# Patient Record
Sex: Male | Born: 1955 | Race: White | Hispanic: No | Marital: Married | Smoking: Former smoker
Health system: Southern US, Community
[De-identification: ages and names within clinical notes are randomized; demographics above are authoritative.]

## PROBLEM LIST (undated history)

## (undated) DIAGNOSIS — F419 Anxiety disorder, unspecified: Secondary | ICD-10-CM

## (undated) DIAGNOSIS — I10 Essential (primary) hypertension: Secondary | ICD-10-CM

## (undated) DIAGNOSIS — E039 Hypothyroidism, unspecified: Secondary | ICD-10-CM

## (undated) DIAGNOSIS — Z87442 Personal history of urinary calculi: Secondary | ICD-10-CM

## (undated) DIAGNOSIS — E785 Hyperlipidemia, unspecified: Secondary | ICD-10-CM

## (undated) DIAGNOSIS — F329 Major depressive disorder, single episode, unspecified: Secondary | ICD-10-CM

## (undated) DIAGNOSIS — F32A Depression, unspecified: Secondary | ICD-10-CM

## (undated) DIAGNOSIS — Z8639 Personal history of other endocrine, nutritional and metabolic disease: Secondary | ICD-10-CM

## (undated) DIAGNOSIS — N529 Male erectile dysfunction, unspecified: Secondary | ICD-10-CM

## (undated) DIAGNOSIS — N486 Induration penis plastica: Secondary | ICD-10-CM

## (undated) HISTORY — PX: OTHER SURGICAL HISTORY: SHX169

## (undated) HISTORY — DX: Anxiety disorder, unspecified: F41.9

## (undated) HISTORY — DX: Induration penis plastica: N48.6

## (undated) HISTORY — DX: Male erectile dysfunction, unspecified: N52.9

## (undated) HISTORY — PX: APPENDECTOMY: SHX54

## (undated) HISTORY — PX: SHOULDER SURGERY: SHX246

## (undated) HISTORY — PX: HEMORRHOID SURGERY: SHX153

## (undated) HISTORY — DX: Hyperlipidemia, unspecified: E78.5

## (undated) HISTORY — DX: Personal history of other endocrine, nutritional and metabolic disease: Z86.39

---

## 2001-09-06 ENCOUNTER — Inpatient Hospital Stay (HOSPITAL_COMMUNITY): Admission: EM | Admit: 2001-09-06 | Discharge: 2001-09-08 | Payer: Self-pay | Admitting: Psychiatry

## 2002-02-14 ENCOUNTER — Emergency Department (HOSPITAL_COMMUNITY): Admission: EM | Admit: 2002-02-14 | Discharge: 2002-02-14 | Payer: Self-pay | Admitting: Emergency Medicine

## 2002-02-15 ENCOUNTER — Other Ambulatory Visit (HOSPITAL_COMMUNITY): Admission: RE | Admit: 2002-02-15 | Discharge: 2002-03-11 | Payer: Self-pay | Admitting: Psychiatry

## 2016-03-28 DIAGNOSIS — R972 Elevated prostate specific antigen [PSA]: Secondary | ICD-10-CM | POA: Diagnosis not present

## 2016-03-28 DIAGNOSIS — Z0000 Encounter for general adult medical examination without abnormal findings: Secondary | ICD-10-CM | POA: Diagnosis not present

## 2016-05-30 DIAGNOSIS — M7712 Lateral epicondylitis, left elbow: Secondary | ICD-10-CM | POA: Diagnosis not present

## 2016-06-07 DIAGNOSIS — M791 Myalgia: Secondary | ICD-10-CM | POA: Diagnosis not present

## 2016-06-07 DIAGNOSIS — F419 Anxiety disorder, unspecified: Secondary | ICD-10-CM | POA: Diagnosis not present

## 2016-06-10 DIAGNOSIS — M62838 Other muscle spasm: Secondary | ICD-10-CM | POA: Diagnosis not present

## 2016-08-05 DIAGNOSIS — Z125 Encounter for screening for malignant neoplasm of prostate: Secondary | ICD-10-CM | POA: Diagnosis not present

## 2016-08-05 DIAGNOSIS — I129 Hypertensive chronic kidney disease with stage 1 through stage 4 chronic kidney disease, or unspecified chronic kidney disease: Secondary | ICD-10-CM | POA: Diagnosis not present

## 2016-08-05 DIAGNOSIS — Z0001 Encounter for general adult medical examination with abnormal findings: Secondary | ICD-10-CM | POA: Diagnosis not present

## 2016-08-12 DIAGNOSIS — Z0001 Encounter for general adult medical examination with abnormal findings: Secondary | ICD-10-CM | POA: Diagnosis not present

## 2016-08-12 DIAGNOSIS — Z23 Encounter for immunization: Secondary | ICD-10-CM | POA: Diagnosis not present

## 2016-09-17 DIAGNOSIS — M25539 Pain in unspecified wrist: Secondary | ICD-10-CM | POA: Diagnosis not present

## 2016-09-17 DIAGNOSIS — M19042 Primary osteoarthritis, left hand: Secondary | ICD-10-CM | POA: Diagnosis not present

## 2016-09-17 DIAGNOSIS — M779 Enthesopathy, unspecified: Secondary | ICD-10-CM | POA: Diagnosis not present

## 2016-09-17 DIAGNOSIS — M1990 Unspecified osteoarthritis, unspecified site: Secondary | ICD-10-CM | POA: Diagnosis not present

## 2016-09-17 DIAGNOSIS — M19041 Primary osteoarthritis, right hand: Secondary | ICD-10-CM | POA: Diagnosis not present

## 2016-11-07 DIAGNOSIS — E890 Postprocedural hypothyroidism: Secondary | ICD-10-CM | POA: Diagnosis not present

## 2016-12-04 DIAGNOSIS — F339 Major depressive disorder, recurrent, unspecified: Secondary | ICD-10-CM | POA: Diagnosis not present

## 2016-12-04 DIAGNOSIS — N182 Chronic kidney disease, stage 2 (mild): Secondary | ICD-10-CM | POA: Diagnosis not present

## 2016-12-04 DIAGNOSIS — E785 Hyperlipidemia, unspecified: Secondary | ICD-10-CM | POA: Diagnosis not present

## 2016-12-04 DIAGNOSIS — I129 Hypertensive chronic kidney disease with stage 1 through stage 4 chronic kidney disease, or unspecified chronic kidney disease: Secondary | ICD-10-CM | POA: Diagnosis not present

## 2017-03-05 DIAGNOSIS — I129 Hypertensive chronic kidney disease with stage 1 through stage 4 chronic kidney disease, or unspecified chronic kidney disease: Secondary | ICD-10-CM | POA: Diagnosis not present

## 2017-03-05 DIAGNOSIS — E890 Postprocedural hypothyroidism: Secondary | ICD-10-CM | POA: Diagnosis not present

## 2017-03-05 DIAGNOSIS — E785 Hyperlipidemia, unspecified: Secondary | ICD-10-CM | POA: Diagnosis not present

## 2017-03-12 DIAGNOSIS — I129 Hypertensive chronic kidney disease with stage 1 through stage 4 chronic kidney disease, or unspecified chronic kidney disease: Secondary | ICD-10-CM | POA: Diagnosis not present

## 2017-03-12 DIAGNOSIS — F339 Major depressive disorder, recurrent, unspecified: Secondary | ICD-10-CM | POA: Diagnosis not present

## 2017-03-12 DIAGNOSIS — M25512 Pain in left shoulder: Secondary | ICD-10-CM | POA: Diagnosis not present

## 2017-03-12 DIAGNOSIS — E890 Postprocedural hypothyroidism: Secondary | ICD-10-CM | POA: Diagnosis not present

## 2017-03-12 DIAGNOSIS — M19011 Primary osteoarthritis, right shoulder: Secondary | ICD-10-CM | POA: Diagnosis not present

## 2017-03-12 DIAGNOSIS — R7989 Other specified abnormal findings of blood chemistry: Secondary | ICD-10-CM | POA: Diagnosis not present

## 2017-03-12 DIAGNOSIS — E785 Hyperlipidemia, unspecified: Secondary | ICD-10-CM | POA: Diagnosis not present

## 2017-03-12 DIAGNOSIS — M25511 Pain in right shoulder: Secondary | ICD-10-CM | POA: Diagnosis not present

## 2017-03-30 DIAGNOSIS — E0500 Thyrotoxicosis with diffuse goiter without thyrotoxic crisis or storm: Secondary | ICD-10-CM | POA: Diagnosis not present

## 2017-03-30 DIAGNOSIS — H35032 Hypertensive retinopathy, left eye: Secondary | ICD-10-CM | POA: Diagnosis not present

## 2017-03-30 DIAGNOSIS — H25013 Cortical age-related cataract, bilateral: Secondary | ICD-10-CM | POA: Diagnosis not present

## 2017-03-30 DIAGNOSIS — H35033 Hypertensive retinopathy, bilateral: Secondary | ICD-10-CM | POA: Diagnosis not present

## 2017-03-30 DIAGNOSIS — H2513 Age-related nuclear cataract, bilateral: Secondary | ICD-10-CM | POA: Diagnosis not present

## 2017-03-30 DIAGNOSIS — H35031 Hypertensive retinopathy, right eye: Secondary | ICD-10-CM | POA: Diagnosis not present

## 2017-04-24 DIAGNOSIS — E890 Postprocedural hypothyroidism: Secondary | ICD-10-CM | POA: Diagnosis not present

## 2017-04-24 DIAGNOSIS — Z0000 Encounter for general adult medical examination without abnormal findings: Secondary | ICD-10-CM | POA: Diagnosis not present

## 2017-05-19 DIAGNOSIS — R972 Elevated prostate specific antigen [PSA]: Secondary | ICD-10-CM | POA: Diagnosis not present

## 2017-05-19 DIAGNOSIS — E291 Testicular hypofunction: Secondary | ICD-10-CM | POA: Diagnosis not present

## 2017-05-21 ENCOUNTER — Other Ambulatory Visit: Payer: Self-pay | Admitting: Urology

## 2017-05-21 DIAGNOSIS — R972 Elevated prostate specific antigen [PSA]: Secondary | ICD-10-CM

## 2017-06-18 ENCOUNTER — Ambulatory Visit (HOSPITAL_COMMUNITY)
Admission: RE | Admit: 2017-06-18 | Discharge: 2017-06-18 | Disposition: A | Discharge status: Home / Self Care | Payer: BLUE CROSS/BLUE SHIELD | Source: Ambulatory Visit | Attending: Urology | Admitting: Urology

## 2017-06-18 DIAGNOSIS — R972 Elevated prostate specific antigen [PSA]: Secondary | ICD-10-CM

## 2017-06-18 LAB — POCT I-STAT CREATININE: CREATININE: 0.9 mg/dL (ref 0.61–1.24)

## 2017-06-18 MED ORDER — LIDOCAINE HCL 2 % EX GEL: 1.0000 "application " | Freq: Once | CUTANEOUS | Status: DC

## 2017-06-18 MED ORDER — LIDOCAINE HCL 2 % EX GEL
CUTANEOUS | Status: AC
  Filled 2017-06-18: qty 30

## 2017-06-18 MED ORDER — GADOBENATE DIMEGLUMINE 529 MG/ML IV SOLN
20.0000 mL | Freq: Once | INTRAVENOUS | Status: AC | PRN
  Administered 2017-06-18: 20 mL via INTRAVENOUS

## 2017-07-09 DIAGNOSIS — E291 Testicular hypofunction: Secondary | ICD-10-CM | POA: Diagnosis not present

## 2017-07-21 DIAGNOSIS — I10 Essential (primary) hypertension: Secondary | ICD-10-CM | POA: Diagnosis not present

## 2017-07-21 DIAGNOSIS — R1032 Left lower quadrant pain: Secondary | ICD-10-CM | POA: Diagnosis not present

## 2017-07-30 DIAGNOSIS — E079 Disorder of thyroid, unspecified: Secondary | ICD-10-CM | POA: Diagnosis not present

## 2017-07-30 DIAGNOSIS — H5022 Vertical strabismus, left eye: Secondary | ICD-10-CM | POA: Diagnosis not present

## 2017-07-30 DIAGNOSIS — H4912 Fourth [trochlear] nerve palsy, left eye: Secondary | ICD-10-CM | POA: Diagnosis not present

## 2017-08-05 DIAGNOSIS — Z0000 Encounter for general adult medical examination without abnormal findings: Secondary | ICD-10-CM | POA: Diagnosis not present

## 2017-08-05 DIAGNOSIS — E785 Hyperlipidemia, unspecified: Secondary | ICD-10-CM | POA: Diagnosis not present

## 2017-08-05 DIAGNOSIS — Z125 Encounter for screening for malignant neoplasm of prostate: Secondary | ICD-10-CM | POA: Diagnosis not present

## 2017-08-13 DIAGNOSIS — Z0001 Encounter for general adult medical examination with abnormal findings: Secondary | ICD-10-CM | POA: Diagnosis not present

## 2017-11-18 DIAGNOSIS — N486 Induration penis plastica: Secondary | ICD-10-CM | POA: Diagnosis not present

## 2017-11-18 DIAGNOSIS — E291 Testicular hypofunction: Secondary | ICD-10-CM | POA: Diagnosis not present

## 2017-11-18 DIAGNOSIS — R972 Elevated prostate specific antigen [PSA]: Secondary | ICD-10-CM | POA: Diagnosis not present

## 2018-02-22 DIAGNOSIS — E785 Hyperlipidemia, unspecified: Secondary | ICD-10-CM | POA: Diagnosis not present

## 2018-02-22 DIAGNOSIS — E039 Hypothyroidism, unspecified: Secondary | ICD-10-CM | POA: Diagnosis not present

## 2018-02-22 DIAGNOSIS — E291 Testicular hypofunction: Secondary | ICD-10-CM | POA: Diagnosis not present

## 2018-02-22 DIAGNOSIS — N182 Chronic kidney disease, stage 2 (mild): Secondary | ICD-10-CM | POA: Diagnosis not present

## 2018-03-01 DIAGNOSIS — E785 Hyperlipidemia, unspecified: Secondary | ICD-10-CM | POA: Diagnosis not present

## 2018-03-01 DIAGNOSIS — F419 Anxiety disorder, unspecified: Secondary | ICD-10-CM | POA: Diagnosis not present

## 2018-03-01 DIAGNOSIS — I10 Essential (primary) hypertension: Secondary | ICD-10-CM | POA: Diagnosis not present

## 2018-03-01 DIAGNOSIS — E890 Postprocedural hypothyroidism: Secondary | ICD-10-CM | POA: Diagnosis not present

## 2018-04-05 DIAGNOSIS — H35033 Hypertensive retinopathy, bilateral: Secondary | ICD-10-CM | POA: Diagnosis not present

## 2018-04-05 DIAGNOSIS — H25013 Cortical age-related cataract, bilateral: Secondary | ICD-10-CM | POA: Diagnosis not present

## 2018-04-05 DIAGNOSIS — H2513 Age-related nuclear cataract, bilateral: Secondary | ICD-10-CM | POA: Diagnosis not present

## 2018-04-05 DIAGNOSIS — H532 Diplopia: Secondary | ICD-10-CM | POA: Diagnosis not present

## 2018-05-19 DIAGNOSIS — E291 Testicular hypofunction: Secondary | ICD-10-CM | POA: Diagnosis not present

## 2018-05-19 DIAGNOSIS — R972 Elevated prostate specific antigen [PSA]: Secondary | ICD-10-CM | POA: Diagnosis not present

## 2018-05-25 DIAGNOSIS — E890 Postprocedural hypothyroidism: Secondary | ICD-10-CM | POA: Diagnosis not present

## 2018-05-26 DIAGNOSIS — N486 Induration penis plastica: Secondary | ICD-10-CM | POA: Diagnosis not present

## 2018-05-26 DIAGNOSIS — E291 Testicular hypofunction: Secondary | ICD-10-CM | POA: Diagnosis not present

## 2018-05-26 DIAGNOSIS — N5201 Erectile dysfunction due to arterial insufficiency: Secondary | ICD-10-CM | POA: Diagnosis not present

## 2018-06-16 ENCOUNTER — Other Ambulatory Visit: Payer: Self-pay | Admitting: Urology

## 2018-06-16 DIAGNOSIS — N132 Hydronephrosis with renal and ureteral calculous obstruction: Secondary | ICD-10-CM | POA: Diagnosis not present

## 2018-06-16 DIAGNOSIS — R109 Unspecified abdominal pain: Secondary | ICD-10-CM | POA: Diagnosis not present

## 2018-06-16 DIAGNOSIS — N201 Calculus of ureter: Secondary | ICD-10-CM | POA: Diagnosis not present

## 2018-06-18 ENCOUNTER — Encounter (HOSPITAL_COMMUNITY): Payer: Self-pay | Admitting: *Deleted

## 2018-06-21 ENCOUNTER — Ambulatory Visit (HOSPITAL_COMMUNITY)
Admission: RE | Admit: 2018-06-21 | Discharge: 2018-06-21 | Disposition: A | Discharge status: Home / Self Care | Payer: BLUE CROSS/BLUE SHIELD | Source: Ambulatory Visit | Attending: Urology | Admitting: Urology

## 2018-06-21 ENCOUNTER — Ambulatory Visit (HOSPITAL_COMMUNITY): Payer: BLUE CROSS/BLUE SHIELD

## 2018-06-21 ENCOUNTER — Encounter (HOSPITAL_COMMUNITY)
Admission: RE | Disposition: A | Discharge status: Home / Self Care | Payer: Self-pay | Source: Ambulatory Visit | Attending: Urology

## 2018-06-21 ENCOUNTER — Encounter (HOSPITAL_COMMUNITY): Payer: Self-pay | Admitting: General Practice

## 2018-06-21 DIAGNOSIS — Z87891 Personal history of nicotine dependence: Secondary | ICD-10-CM | POA: Insufficient documentation

## 2018-06-21 DIAGNOSIS — Z79899 Other long term (current) drug therapy: Secondary | ICD-10-CM | POA: Diagnosis not present

## 2018-06-21 DIAGNOSIS — N132 Hydronephrosis with renal and ureteral calculous obstruction: Secondary | ICD-10-CM | POA: Diagnosis not present

## 2018-06-21 DIAGNOSIS — Z6831 Body mass index (BMI) 31.0-31.9, adult: Secondary | ICD-10-CM | POA: Insufficient documentation

## 2018-06-21 DIAGNOSIS — N200 Calculus of kidney: Secondary | ICD-10-CM

## 2018-06-21 DIAGNOSIS — N201 Calculus of ureter: Secondary | ICD-10-CM

## 2018-06-21 DIAGNOSIS — I10 Essential (primary) hypertension: Secondary | ICD-10-CM | POA: Insufficient documentation

## 2018-06-21 DIAGNOSIS — F329 Major depressive disorder, single episode, unspecified: Secondary | ICD-10-CM | POA: Insufficient documentation

## 2018-06-21 DIAGNOSIS — E669 Obesity, unspecified: Secondary | ICD-10-CM | POA: Insufficient documentation

## 2018-06-21 DIAGNOSIS — E039 Hypothyroidism, unspecified: Secondary | ICD-10-CM | POA: Diagnosis not present

## 2018-06-21 DIAGNOSIS — Z01818 Encounter for other preprocedural examination: Secondary | ICD-10-CM | POA: Diagnosis not present

## 2018-06-21 DIAGNOSIS — Z87442 Personal history of urinary calculi: Secondary | ICD-10-CM | POA: Diagnosis not present

## 2018-06-21 HISTORY — DX: Personal history of urinary calculi: Z87.442

## 2018-06-21 HISTORY — PX: EXTRACORPOREAL SHOCK WAVE LITHOTRIPSY: SHX1557

## 2018-06-21 HISTORY — DX: Major depressive disorder, single episode, unspecified: F32.9

## 2018-06-21 HISTORY — DX: Hypothyroidism, unspecified: E03.9

## 2018-06-21 HISTORY — DX: Essential (primary) hypertension: I10

## 2018-06-21 HISTORY — DX: Depression, unspecified: F32.A

## 2018-06-21 SURGERY — LITHOTRIPSY, ESWL
Anesthesia: LOCAL | Laterality: Right

## 2018-06-21 MED ORDER — SODIUM CHLORIDE 0.9 % IV SOLN
Freq: Once | INTRAVENOUS | Status: AC
  Administered 2018-06-21: 07:00:00 via INTRAVENOUS

## 2018-06-21 MED ORDER — OXYCODONE-ACETAMINOPHEN 5-325 MG PO TABS: 1.0000 | ORAL_TABLET | Freq: Four times a day (QID) | ORAL | 0 refills | Status: DC | PRN

## 2018-06-21 MED ORDER — TAMSULOSIN HCL 0.4 MG PO CAPS: 0.4000 mg | ORAL_CAPSULE | Freq: Every day | ORAL | 0 refills | Status: DC

## 2018-06-21 MED ORDER — DIPHENHYDRAMINE HCL 25 MG PO CAPS
25.0000 mg | ORAL_CAPSULE | ORAL | Status: AC
  Administered 2018-06-21: 25 mg via ORAL
  Filled 2018-06-21: qty 1

## 2018-06-21 MED ORDER — CIPROFLOXACIN HCL 500 MG PO TABS
500.0000 mg | ORAL_TABLET | ORAL | Status: AC
  Administered 2018-06-21: 500 mg via ORAL
  Filled 2018-06-21: qty 1

## 2018-06-21 MED ORDER — DIAZEPAM 5 MG PO TABS
10.0000 mg | ORAL_TABLET | ORAL | Status: AC
  Administered 2018-06-21: 10 mg via ORAL
  Filled 2018-06-21: qty 2

## 2018-06-21 NOTE — H&P (Signed)
Troy Turner is an 62 y.o. male.    Chief Complaint: Pre-Op RIGHT Shckwave Lithotripsy  HPI:   1 - RIGHT Ureteral Stone -  9mm Rt prox ureteral stone with mod hydro by CT 05/2018 on eval right flank pain. Ston is 9mm, SSD 18cm, 1300HU. Several bilateral renal stones as well, non-obstructing. UA without infectious parameters.  Today "Troy Turner" is seen to proceed with RIGHT shockwave lithotripsy. NO interval high grade fevers.    Past Medical History:  Diagnosis Date  . Depression   . History of kidney stones   . Hypertension   . Hypothyroidism    history of graves disease    Past Surgical History:  Procedure Laterality Date  . APPENDECTOMY    . HEMORRHOID SURGERY    . undesended testicle      History reviewed. No pertinent family history. Social History:  reports that he quit smoking about 15 years ago. He has never used smokeless tobacco. He reports that he does not use drugs. His alcohol history is not on file.  Allergies: No Known Allergies  Medications Prior to Admission  Medication Sig Dispense Refill  . amLODipine (NORVASC) 10 MG tablet Take 10 mg by mouth daily.    . fosinopril (MONOPRIL) 20 MG tablet Take 20 mg by mouth daily.    . hydrochlorothiazide (MICROZIDE) 12.5 MG capsule Take 12.5 mg by mouth daily.    Marland Kitchen. levothyroxine (SYNTHROID, LEVOTHROID) 137 MCG tablet Take 137 mcg by mouth daily before breakfast.    . ondansetron (ZOFRAN) 8 MG tablet Take by mouth every 8 (eight) hours as needed for nausea or vomiting.    Marland Kitchen. oxyCODONE-acetaminophen (PERCOCET/ROXICET) 5-325 MG tablet Take by mouth every 6 (six) hours as needed for severe pain.    Marland Kitchen. sertraline (ZOLOFT) 100 MG tablet Take 100 mg by mouth daily.    . simvastatin (ZOCOR) 5 MG tablet Take 5 mg by mouth daily.    . tamsulosin (FLOMAX) 0.4 MG CAPS capsule Take 0.4 mg by mouth.    . traZODone (DESYREL) 100 MG tablet Take 100 mg by mouth at bedtime.      No results found for this or any previous visit (from  the past 48 hour(s)). No results found.  Review of Systems  Constitutional: Negative.   Eyes: Negative.   Respiratory: Negative.   Cardiovascular: Negative.   Genitourinary: Positive for flank pain.  Skin: Negative.   Neurological: Negative.   Endo/Heme/Allergies: Negative.   Psychiatric/Behavioral: Negative.     Blood pressure 139/79, pulse 69, temperature 99 F (37.2 C), temperature source Oral, resp. rate 18, height 5\' 11"  (1.803 m), weight 104 kg (229 lb 6 oz), SpO2 98 %. Physical Exam  Constitutional: He appears well-developed.  HENT:  Head: Normocephalic.  Neck: Normal range of motion.  Cardiovascular: Normal rate.  Respiratory: Effort normal.  GI:  Truncal obesity.   Genitourinary:  Genitourinary Comments: Mild Rt CVAT  Neurological: He is alert.  Skin: Skin is warm.  Psychiatric: He has a normal mood and affect.     Assessment/Plan  Proceed as planned with RIGHT SWL. Risks, benefits, alternatives, expected peri-op course discussed previously and reiterated today.   Sebastian AcheMANNY, Keyari Kleeman, MD 06/21/2018, 7:05 AM

## 2018-06-21 NOTE — Discharge Instructions (Signed)
1 - You may have urinary urgency (bladder spasms),. Pass small stone fragments,  and bloody urine on / off for about 2 weeks. This is normal.  2 - Call MD or go to ER for fever >102, severe pain / nausea / vomiting not relieved by medications, or acute change in medical status

## 2018-06-21 NOTE — Brief Op Note (Signed)
06/21/2018  8:40 AM  PATIENT:  Troy Turner  62 y.o. male  PRE-OPERATIVE DIAGNOSIS:  RIGHT PROXIMAL URETERAL STONE  POST-OPERATIVE DIAGNOSIS:  * No post-op diagnosis entered *  PROCEDURE:  Procedure(s): RIGHT EXTRACORPOREAL SHOCK WAVE LITHOTRIPSY (ESWL) (Right)  SURGEON:  Surgeon(s) and Role:    * Alexis Frock, MD - Primary  PHYSICIAN ASSISTANT:   ASSISTANTS: none   ANESTHESIA:   MAC  EBL:  none   BLOOD ADMINISTERED:none  DRAINS: none   LOCAL MEDICATIONS USED:  NONE  SPECIMEN:  No Specimen  DISPOSITION OF SPECIMEN:  N/A  COUNTS:  YES  TOURNIQUET:  * No tourniquets in log *  DICTATION: .Note written in paper chart  PLAN OF CARE: Discharge to home after PACU  PATIENT DISPOSITION:  Short Stay   Delay start of Pharmacological VTE agent (>24hrs) due to surgical blood loss or risk of bleeding: not applicable

## 2018-07-12 DIAGNOSIS — N200 Calculus of kidney: Secondary | ICD-10-CM | POA: Diagnosis not present

## 2018-07-12 DIAGNOSIS — R8271 Bacteriuria: Secondary | ICD-10-CM | POA: Diagnosis not present

## 2018-07-26 DIAGNOSIS — N202 Calculus of kidney with calculus of ureter: Secondary | ICD-10-CM | POA: Diagnosis not present

## 2018-07-26 DIAGNOSIS — R8271 Bacteriuria: Secondary | ICD-10-CM | POA: Diagnosis not present

## 2018-08-11 DIAGNOSIS — N200 Calculus of kidney: Secondary | ICD-10-CM | POA: Diagnosis not present

## 2018-08-26 DIAGNOSIS — Z0000 Encounter for general adult medical examination without abnormal findings: Secondary | ICD-10-CM | POA: Diagnosis not present

## 2018-08-26 DIAGNOSIS — Z125 Encounter for screening for malignant neoplasm of prostate: Secondary | ICD-10-CM | POA: Diagnosis not present

## 2018-08-26 DIAGNOSIS — E890 Postprocedural hypothyroidism: Secondary | ICD-10-CM | POA: Diagnosis not present

## 2018-08-30 IMAGING — MR MR PROSTATE WO/W CM
23 of 56 series · 23 of 56 positions shown · IV contrast (yes)
Comparison: None.

CLINICAL DATA: Elevated PSA level. Biopsy from 06/26/2015 revealed
intraepithelial neoplasia at the left base.

EXAM:
MR PROSTATE WITHOUT AND WITH CONTRAST
TECHNIQUE: Multiplanar multisequence MRI images were obtained of the pelvis
centered about the prostate. Pre and post contrast images were
obtained.
CONTRAST:  20mL MULTIHANCE GADOBENATE DIMEGLUMINE 529 MG/ML IV SOLN

[Series 3: bSSFP fat-sat · axial · 6.0mm · 0.86mm/px · 1 of 44 slices shown]
[im 1/44]
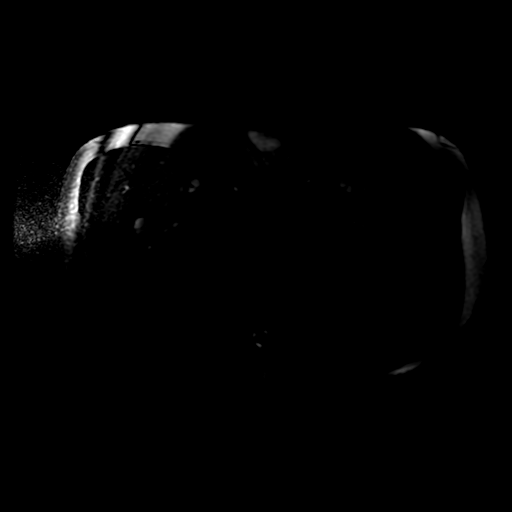

[Series 4: T1 · axial · 6.0mm · 0.86mm/px · 1 of 44 slices shown (1 of 2)]
[im 1/44]
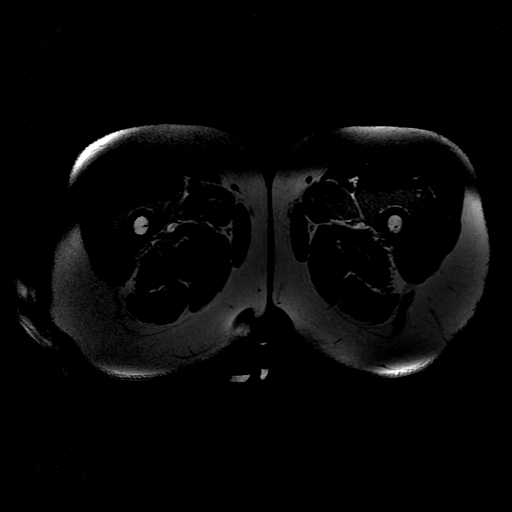

[Series 5: T2 · axial · 3.0mm · 0.29mm/px · 1 of 25 slices shown (1 of 4)]
[im 1/25]
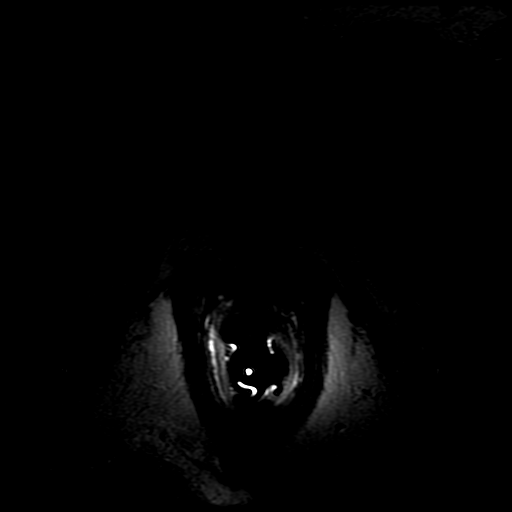

[Series 6: T1 · axial · 3.0mm · 0.29mm/px · 1 of 25 slices shown (2 of 2)]
[im 1/25]
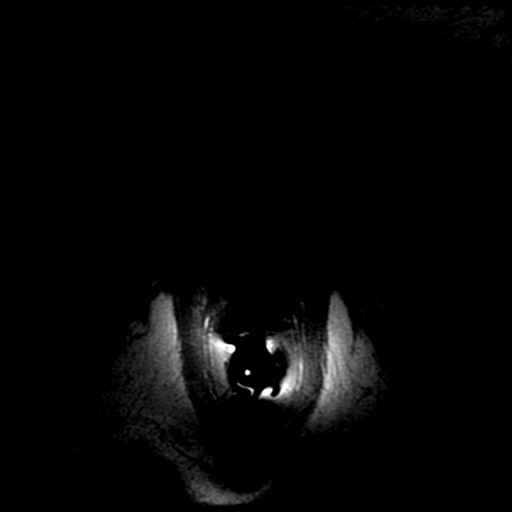

[Series 7: T2 · axial · 1.8mm · 0.47mm/px · 1 of 140 slices shown (2 of 4)]
[im 1/140]
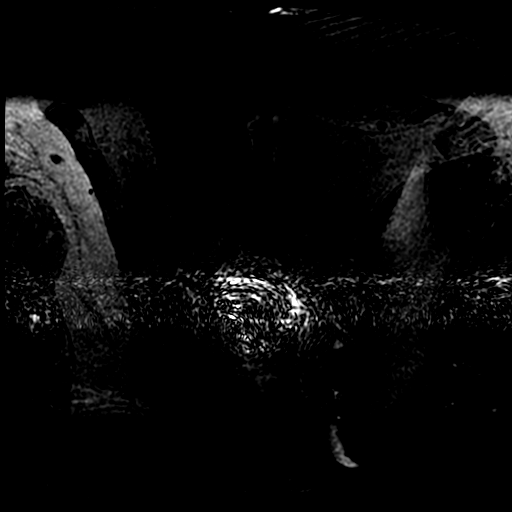

[Series 8: T2 · sagittal · 4.0mm · 0.29mm/px · 1 of 22 slices shown (3 of 4)]
[im 1/22]
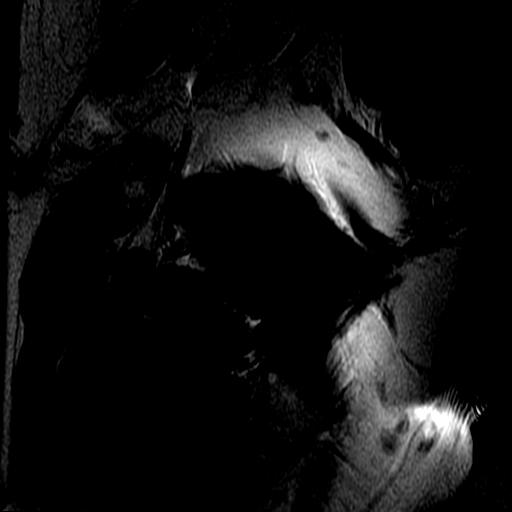

[Series 9: T2 · coronal · 4.0mm · 0.29mm/px · 1 of 21 slices shown (4 of 4)]
[im 1/21]
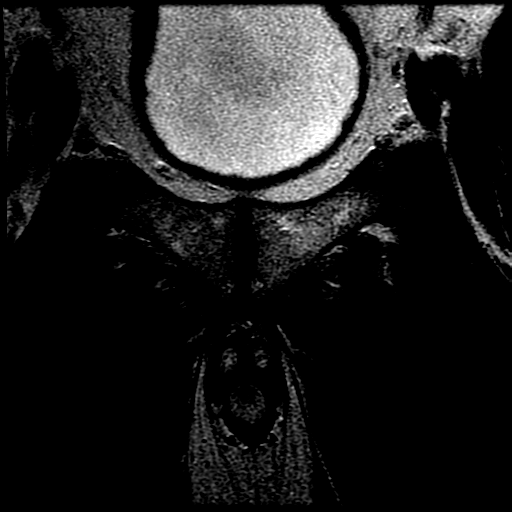

[Series 10: DWI · axial · 3.0mm · 0.59mm/px · 1 of 50 slices shown (1 of 6)]
[im 1/50]
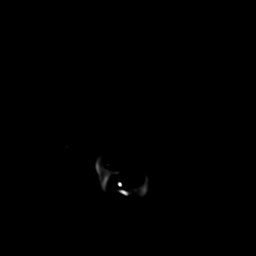

[Series 11: DWI · axial · 3.0mm · 0.59mm/px · 1 of 50 slices shown (2 of 6)]
[im 1/50]
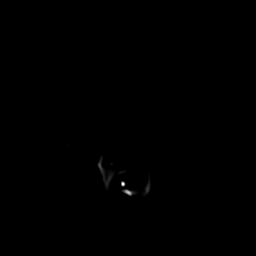

[Series 12: DWI · axial · 3.0mm · 0.59mm/px · 1 of 48 slices shown (3 of 6)]
[im 1/48]
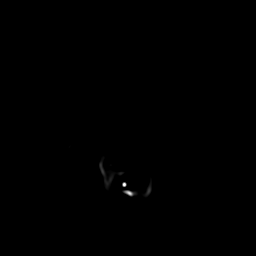

[Series 700: reformatted · coronal · 2.0mm · 0.39mm/px · 1 of 69 slices shown (1 of 2)]
[im 1/69]
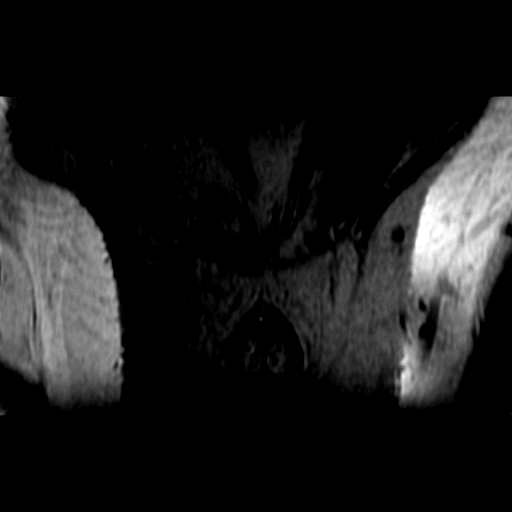

[Series 701: reformatted · sagittal · 2.0mm · 0.43mm/px · 1 of 63 slices shown (2 of 2)]
[im 1/63]
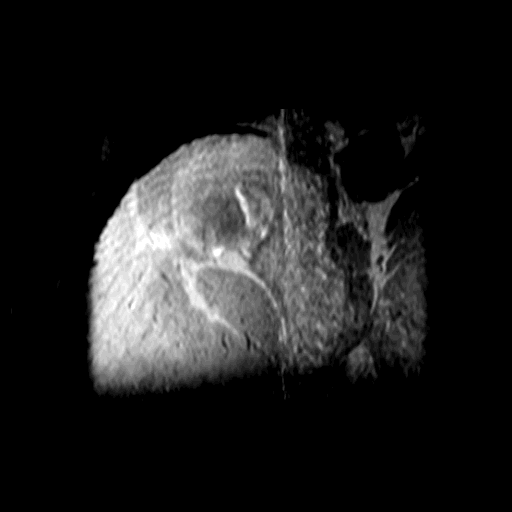

[Series 1000: DWI · axial · 3.0mm · 0.59mm/px · 1 of 25 slices shown (4 of 6)]
[im 1/25]
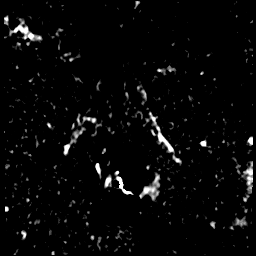

[Series 1100: DWI · axial · 3.0mm · 0.59mm/px · 1 of 25 slices shown (5 of 6)]
[im 1/25]
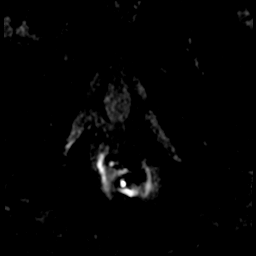

[Series 1200: DWI · axial · 3.0mm · 0.59mm/px · 1 of 25 slices shown (6 of 6)]
[im 1/25]
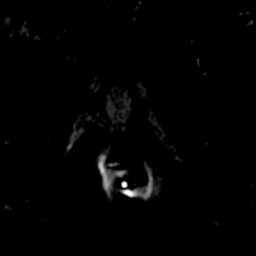

[((id)/(id)/1)-((id)/(id)/1) · axial · 3.0mm · 0.43mm/px · 1 of 68 slices shown (1 of 8)]
[im 1/68]
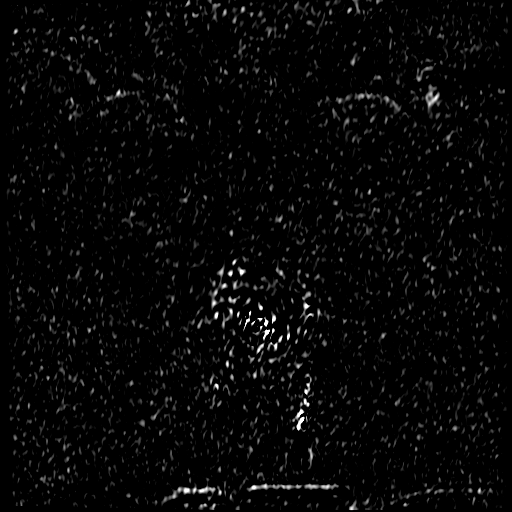

[((id)/(id)/1)-((id)/(id)/1) · axial · 3.0mm · 0.43mm/px · 1 of 68 slices shown (2 of 8)]
[im 1/68]
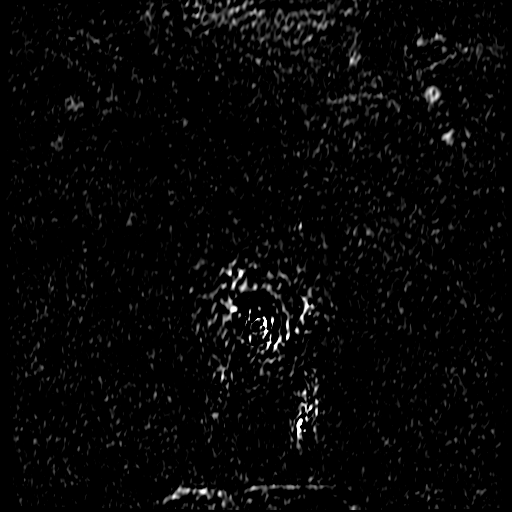

[((id)/(id)/1)-((id)/(id)/1) · axial · 3.0mm · 0.43mm/px · 1 of 68 slices shown (3 of 8)]
[im 1/68]
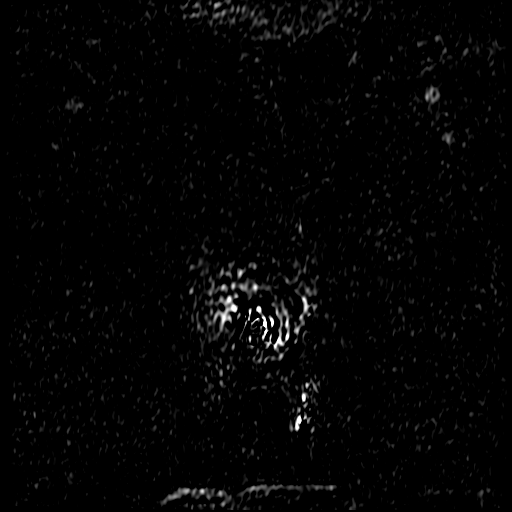

[((id)/(id)/1)-((id)/(id)/1) · axial · 3.0mm · 0.43mm/px · 1 of 68 slices shown (4 of 8)]
[im 1/68]
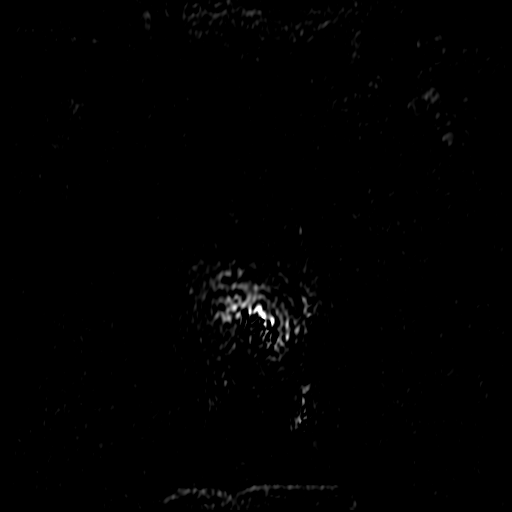

[((id)/(id)/1)-((id)/(id)/1) · axial · 3.0mm · 0.43mm/px · 1 of 68 slices shown (5 of 8)]
[im 1/68]
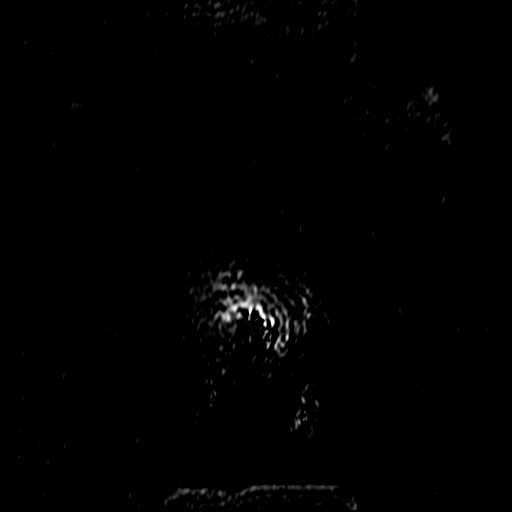

[((id)/(id)/1)-((id)/(id)/1) · axial · 3.0mm · 0.43mm/px · 1 of 68 slices shown (6 of 8)]
[im 1/68]
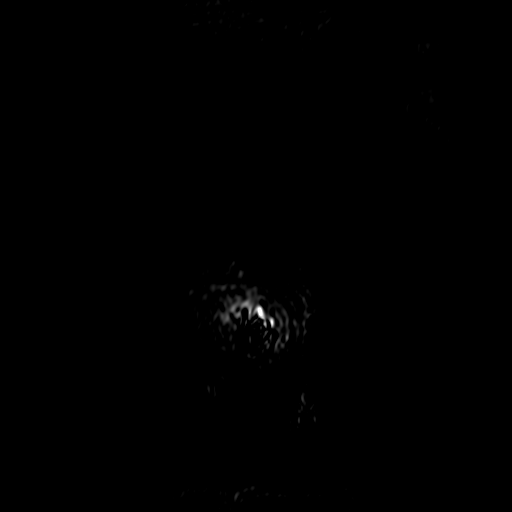

[((id)/(id)/1)-((id)/(id)/1) · axial · 3.0mm · 0.43mm/px · 1 of 68 slices shown (7 of 8)]
[im 1/68]
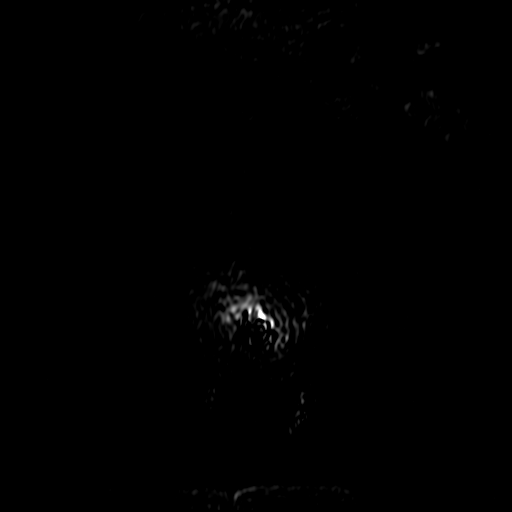

[((id)/(id)/1)-((id)/(id)/1) · axial · 3.0mm · 0.43mm/px · 1 of 68 slices shown (8 of 8)]
[im 1/68]
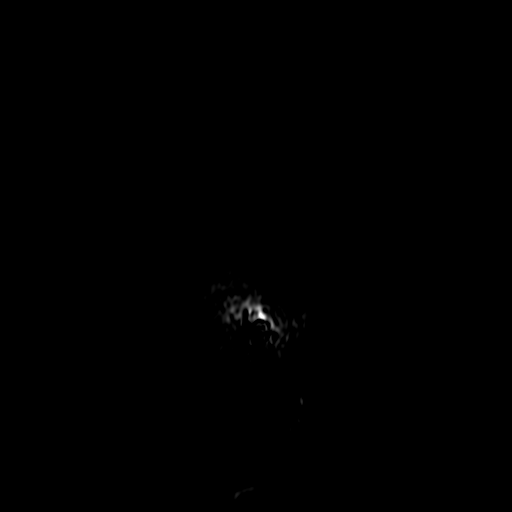

[23 of 56 positions shown; findings below may reference images not displayed]

FINDINGS: Prostate: T2 weighted axial high-resolution images demonstrate some
faint stranding in the peripheral of the prostate gland bilaterally
but without a focal well-defined T2 hypointense lesion. Similarly,
no specific abnormal focal hypointensity on the ADC map images is
identified, nor is there obvious focal early enhancement or
restricted diffusion. Because there was no lesion to characterize,
UroNAV data was not produced.

Volume: 3.1 by 5.0 by 4.3 cm (volume = 35 cm^3).

Transcapsular spread:  Absent

Seminal vesicle involvement: Absent

Neurovascular bundle involvement: Absent

Pelvic adenopathy: Absent

Bone metastasis: Absent

Other findings: No supplemental non-categorized findings.
IMPRESSION: 1. No specific lesion of concern is revealed on today's prostate
MRI. Prostate volume estimated at 35 cubic cm.

## 2018-09-02 DIAGNOSIS — Z0000 Encounter for general adult medical examination without abnormal findings: Secondary | ICD-10-CM | POA: Diagnosis not present

## 2018-09-02 DIAGNOSIS — I10 Essential (primary) hypertension: Secondary | ICD-10-CM | POA: Diagnosis not present

## 2018-09-20 ENCOUNTER — Encounter (HOSPITAL_COMMUNITY): Payer: Self-pay | Admitting: Urology

## 2018-09-20 DIAGNOSIS — N200 Calculus of kidney: Secondary | ICD-10-CM | POA: Diagnosis not present

## 2018-09-20 DIAGNOSIS — R972 Elevated prostate specific antigen [PSA]: Secondary | ICD-10-CM | POA: Diagnosis not present

## 2018-09-21 DIAGNOSIS — N200 Calculus of kidney: Secondary | ICD-10-CM | POA: Diagnosis not present

## 2018-10-20 DIAGNOSIS — R972 Elevated prostate specific antigen [PSA]: Secondary | ICD-10-CM | POA: Diagnosis not present

## 2018-10-20 DIAGNOSIS — N200 Calculus of kidney: Secondary | ICD-10-CM | POA: Diagnosis not present

## 2018-11-23 DIAGNOSIS — R972 Elevated prostate specific antigen [PSA]: Secondary | ICD-10-CM | POA: Diagnosis not present

## 2019-03-10 DIAGNOSIS — F339 Major depressive disorder, recurrent, unspecified: Secondary | ICD-10-CM | POA: Diagnosis not present

## 2019-03-10 DIAGNOSIS — E785 Hyperlipidemia, unspecified: Secondary | ICD-10-CM | POA: Diagnosis not present

## 2019-03-10 DIAGNOSIS — E890 Postprocedural hypothyroidism: Secondary | ICD-10-CM | POA: Diagnosis not present

## 2019-03-10 DIAGNOSIS — I10 Essential (primary) hypertension: Secondary | ICD-10-CM | POA: Diagnosis not present

## 2019-04-05 DIAGNOSIS — E291 Testicular hypofunction: Secondary | ICD-10-CM | POA: Diagnosis not present

## 2019-04-05 DIAGNOSIS — R972 Elevated prostate specific antigen [PSA]: Secondary | ICD-10-CM | POA: Diagnosis not present

## 2019-04-05 DIAGNOSIS — N486 Induration penis plastica: Secondary | ICD-10-CM | POA: Diagnosis not present

## 2019-04-05 DIAGNOSIS — N200 Calculus of kidney: Secondary | ICD-10-CM | POA: Diagnosis not present

## 2019-04-08 DIAGNOSIS — E7800 Pure hypercholesterolemia, unspecified: Secondary | ICD-10-CM | POA: Diagnosis not present

## 2019-04-08 DIAGNOSIS — Z79899 Other long term (current) drug therapy: Secondary | ICD-10-CM | POA: Diagnosis not present

## 2019-04-08 DIAGNOSIS — Z0000 Encounter for general adult medical examination without abnormal findings: Secondary | ICD-10-CM | POA: Diagnosis not present

## 2019-04-08 DIAGNOSIS — Z5181 Encounter for therapeutic drug level monitoring: Secondary | ICD-10-CM | POA: Diagnosis not present

## 2019-08-25 DIAGNOSIS — H4912 Fourth [trochlear] nerve palsy, left eye: Secondary | ICD-10-CM | POA: Diagnosis not present

## 2019-08-25 DIAGNOSIS — E0500 Thyrotoxicosis with diffuse goiter without thyrotoxic crisis or storm: Secondary | ICD-10-CM | POA: Diagnosis not present

## 2019-08-25 DIAGNOSIS — H25013 Cortical age-related cataract, bilateral: Secondary | ICD-10-CM | POA: Diagnosis not present

## 2019-08-25 DIAGNOSIS — H2513 Age-related nuclear cataract, bilateral: Secondary | ICD-10-CM | POA: Diagnosis not present

## 2019-08-25 DIAGNOSIS — H35033 Hypertensive retinopathy, bilateral: Secondary | ICD-10-CM | POA: Diagnosis not present

## 2019-08-25 DIAGNOSIS — H524 Presbyopia: Secondary | ICD-10-CM | POA: Diagnosis not present

## 2019-09-02 IMAGING — CR DG ABDOMEN 1V
2 series · 2 of 2 positions shown · non-contrast
Comparison: KUB June 16, 2018

CLINICAL DATA: Preoperative examination prior to lithotripsy for
right-sided kidney stone.

EXAM:
ABDOMEN - 1 VIEW

[t abdomen supine (1 of 2)]
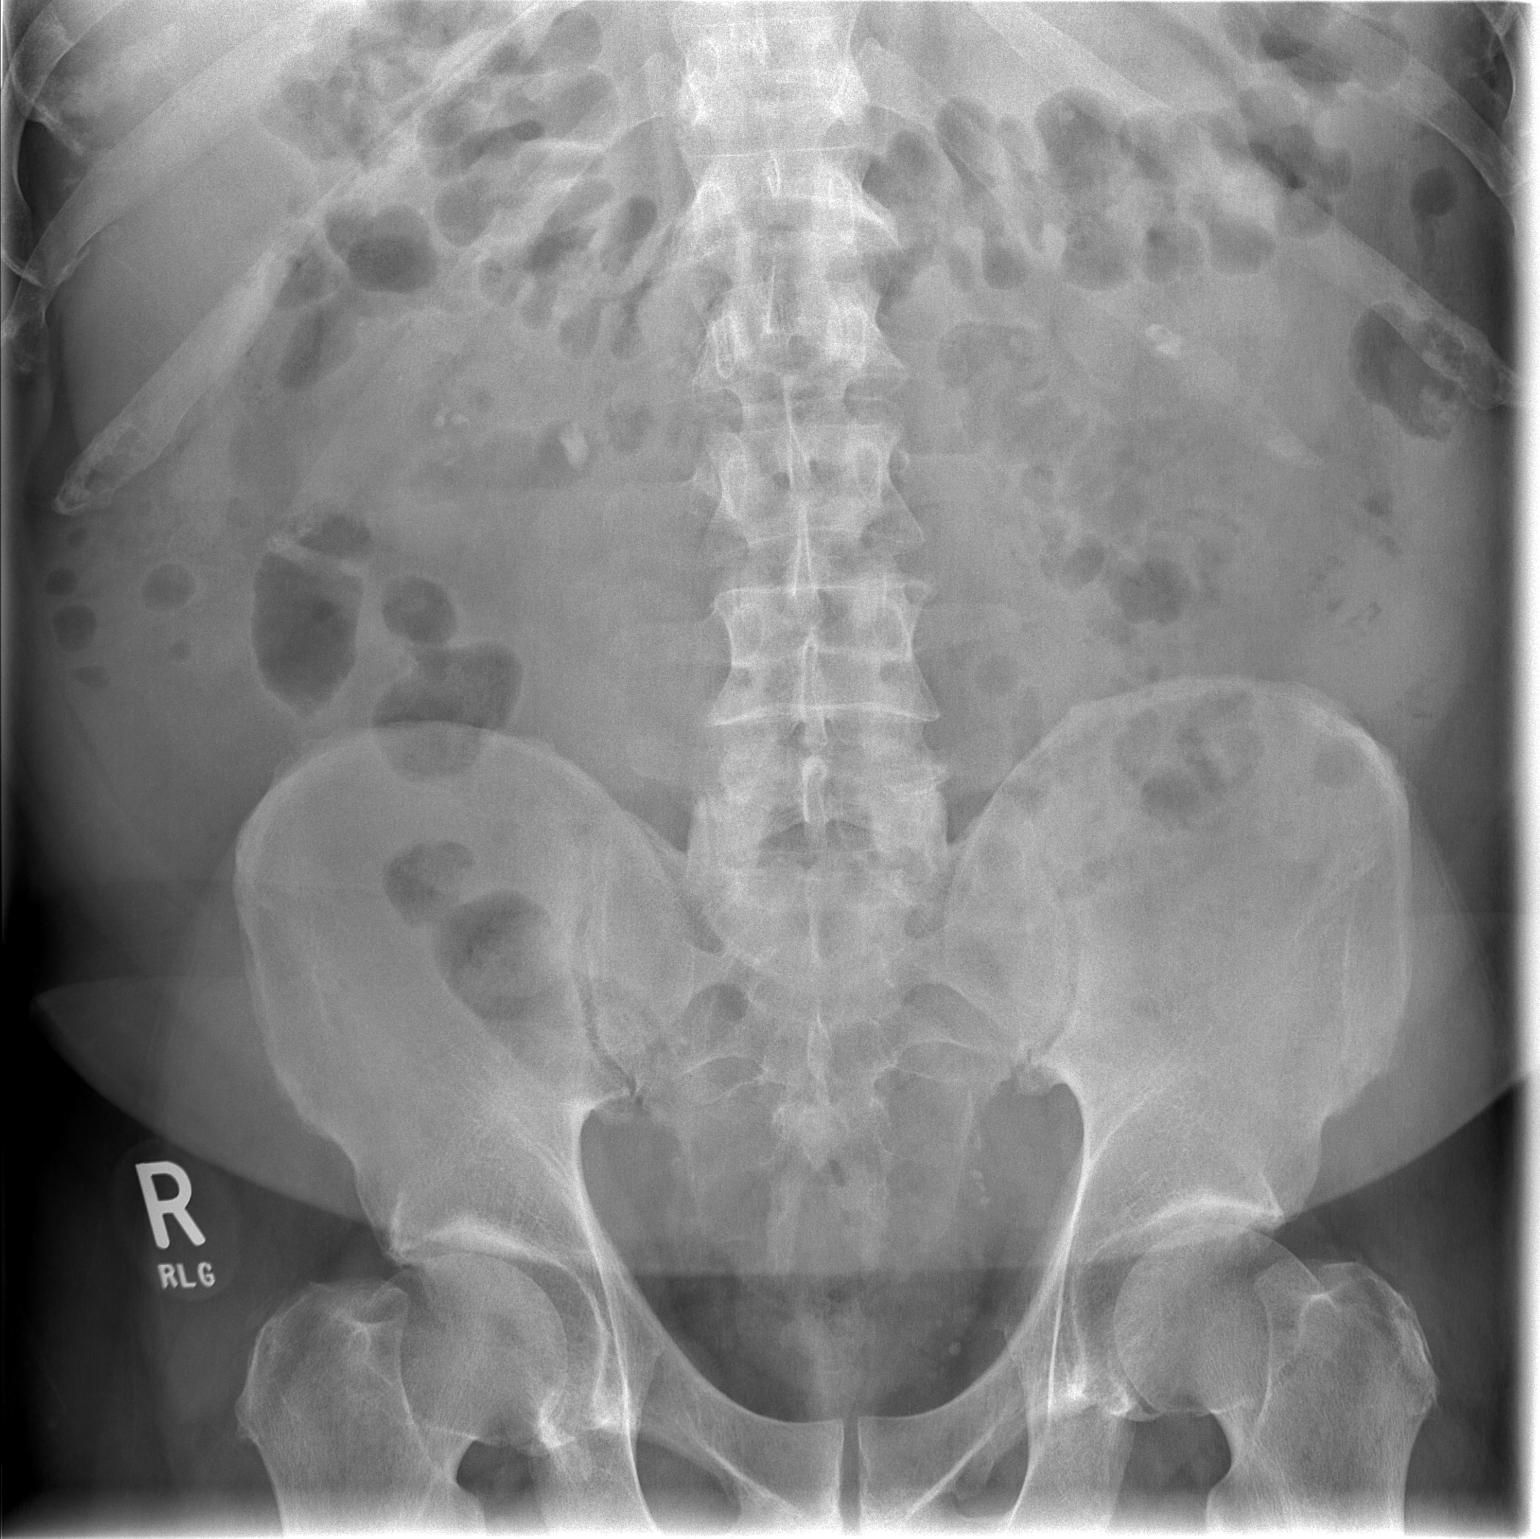

[t abdomen supine (2 of 2)]
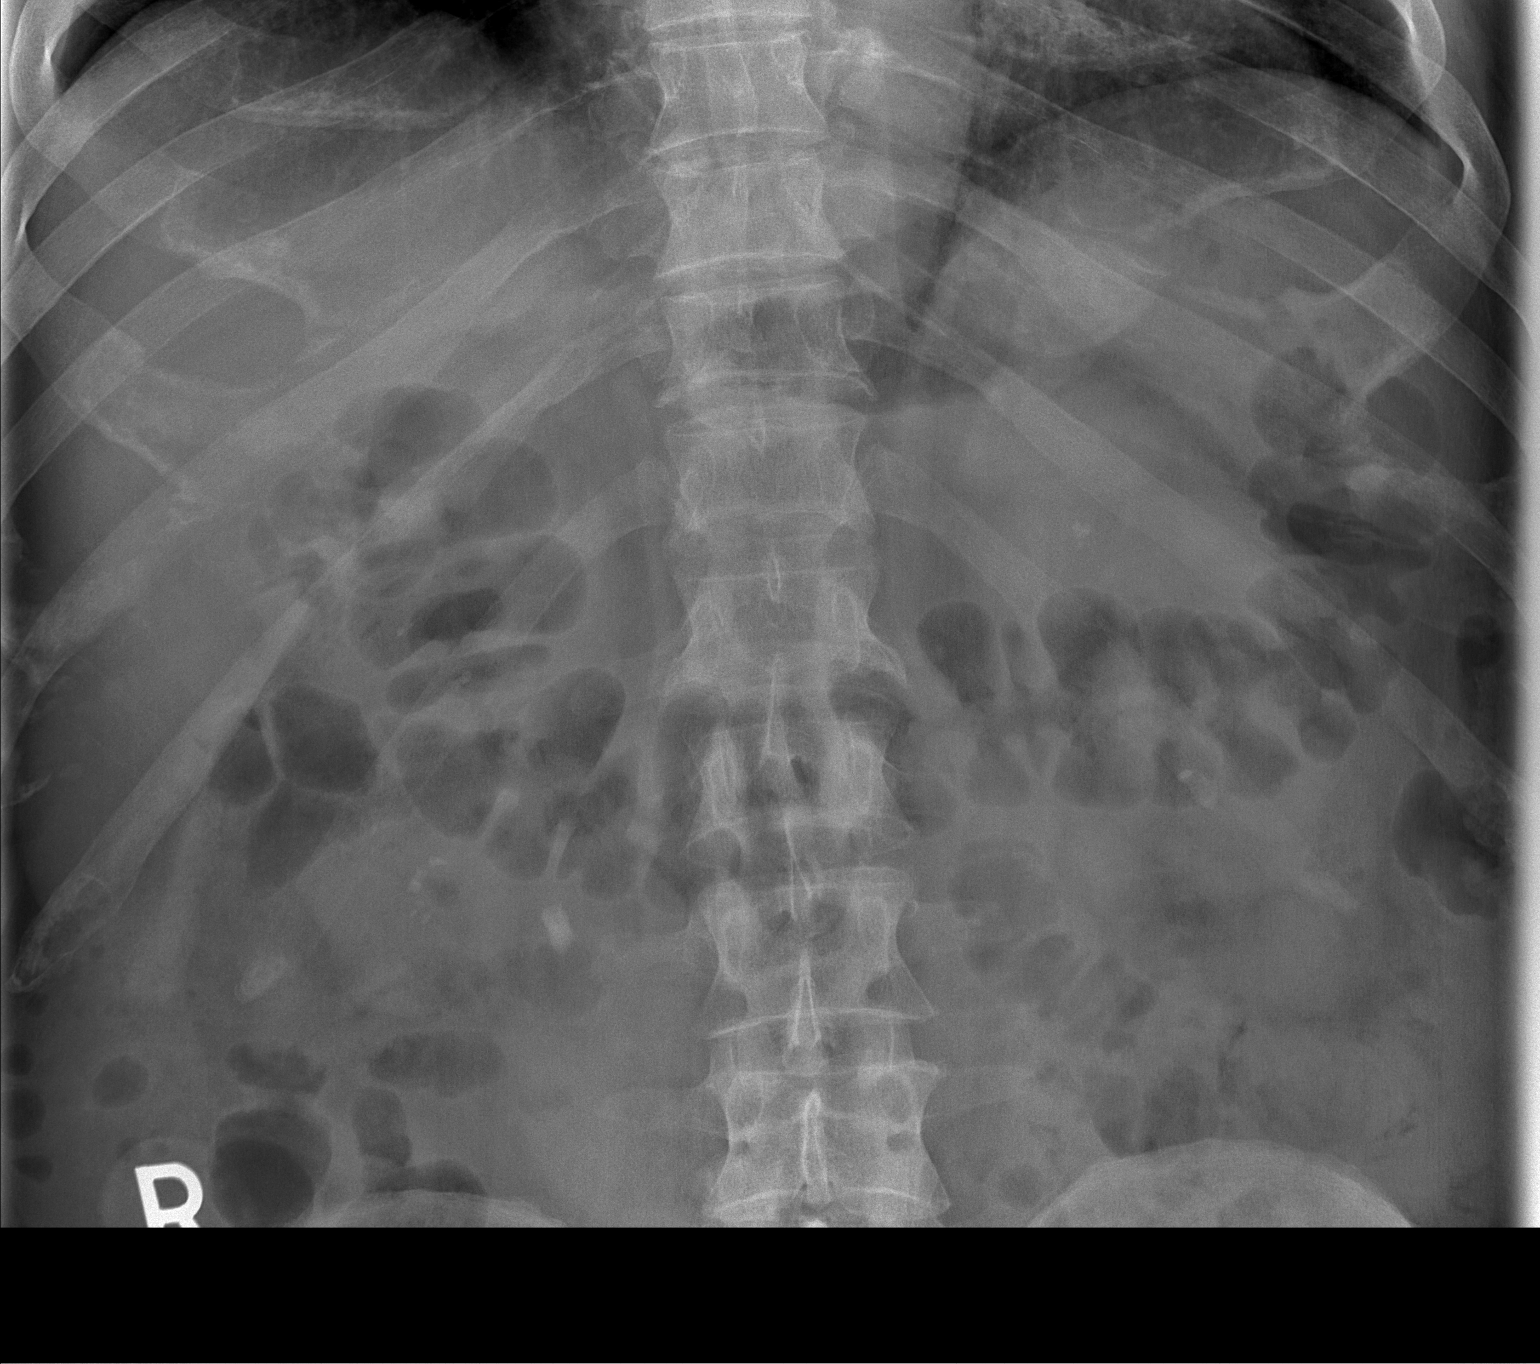

[2 of 2 positions shown; findings below may reference images not displayed]

FINDINGS: There is a persistent large stone in the region of the right UPJ.
Small right and left kidney stones are observed. There are multiple
pelvic calcifications greatest on the left which appears stable and
may reflect phleboliths. The bowel gas pattern is unremarkable.
IMPRESSION: Stable large stone projecting in the region of the right UPJ.

## 2019-09-05 DIAGNOSIS — Z0000 Encounter for general adult medical examination without abnormal findings: Secondary | ICD-10-CM | POA: Diagnosis not present

## 2019-09-05 DIAGNOSIS — Z125 Encounter for screening for malignant neoplasm of prostate: Secondary | ICD-10-CM | POA: Diagnosis not present

## 2019-09-05 DIAGNOSIS — Z1159 Encounter for screening for other viral diseases: Secondary | ICD-10-CM | POA: Diagnosis not present

## 2019-09-05 DIAGNOSIS — E039 Hypothyroidism, unspecified: Secondary | ICD-10-CM | POA: Diagnosis not present

## 2019-09-07 DIAGNOSIS — I10 Essential (primary) hypertension: Secondary | ICD-10-CM | POA: Diagnosis not present

## 2019-09-08 DIAGNOSIS — E039 Hypothyroidism, unspecified: Secondary | ICD-10-CM | POA: Diagnosis not present

## 2019-09-08 DIAGNOSIS — E7800 Pure hypercholesterolemia, unspecified: Secondary | ICD-10-CM | POA: Diagnosis not present

## 2019-09-08 DIAGNOSIS — Z23 Encounter for immunization: Secondary | ICD-10-CM | POA: Diagnosis not present

## 2019-09-08 DIAGNOSIS — Z0001 Encounter for general adult medical examination with abnormal findings: Secondary | ICD-10-CM | POA: Diagnosis not present

## 2019-09-08 DIAGNOSIS — F419 Anxiety disorder, unspecified: Secondary | ICD-10-CM | POA: Diagnosis not present

## 2019-09-27 DIAGNOSIS — E291 Testicular hypofunction: Secondary | ICD-10-CM | POA: Diagnosis not present

## 2019-09-27 DIAGNOSIS — R972 Elevated prostate specific antigen [PSA]: Secondary | ICD-10-CM | POA: Diagnosis not present

## 2019-10-04 DIAGNOSIS — R972 Elevated prostate specific antigen [PSA]: Secondary | ICD-10-CM | POA: Diagnosis not present

## 2019-10-04 DIAGNOSIS — N200 Calculus of kidney: Secondary | ICD-10-CM | POA: Diagnosis not present

## 2019-10-04 DIAGNOSIS — E291 Testicular hypofunction: Secondary | ICD-10-CM | POA: Diagnosis not present

## 2019-11-03 DIAGNOSIS — E039 Hypothyroidism, unspecified: Secondary | ICD-10-CM | POA: Diagnosis not present

## 2019-11-30 DIAGNOSIS — Z20828 Contact with and (suspected) exposure to other viral communicable diseases: Secondary | ICD-10-CM | POA: Diagnosis not present

## 2020-01-09 DIAGNOSIS — M25512 Pain in left shoulder: Secondary | ICD-10-CM | POA: Diagnosis not present

## 2020-01-09 DIAGNOSIS — M7542 Impingement syndrome of left shoulder: Secondary | ICD-10-CM | POA: Diagnosis not present

## 2020-01-24 DIAGNOSIS — Z23 Encounter for immunization: Secondary | ICD-10-CM | POA: Diagnosis not present

## 2020-02-08 DIAGNOSIS — M25512 Pain in left shoulder: Secondary | ICD-10-CM | POA: Diagnosis not present

## 2020-02-08 DIAGNOSIS — M7542 Impingement syndrome of left shoulder: Secondary | ICD-10-CM | POA: Diagnosis not present

## 2020-02-18 DIAGNOSIS — M25512 Pain in left shoulder: Secondary | ICD-10-CM | POA: Diagnosis not present

## 2020-02-29 DIAGNOSIS — M7542 Impingement syndrome of left shoulder: Secondary | ICD-10-CM | POA: Diagnosis not present

## 2020-03-27 DIAGNOSIS — E291 Testicular hypofunction: Secondary | ICD-10-CM | POA: Diagnosis not present

## 2020-03-27 DIAGNOSIS — Z125 Encounter for screening for malignant neoplasm of prostate: Secondary | ICD-10-CM | POA: Diagnosis not present

## 2020-04-03 DIAGNOSIS — R972 Elevated prostate specific antigen [PSA]: Secondary | ICD-10-CM | POA: Diagnosis not present

## 2020-04-03 DIAGNOSIS — N200 Calculus of kidney: Secondary | ICD-10-CM | POA: Diagnosis not present

## 2020-04-03 DIAGNOSIS — E291 Testicular hypofunction: Secondary | ICD-10-CM | POA: Diagnosis not present

## 2020-05-15 DIAGNOSIS — H2513 Age-related nuclear cataract, bilateral: Secondary | ICD-10-CM | POA: Diagnosis not present

## 2020-05-15 DIAGNOSIS — H25013 Cortical age-related cataract, bilateral: Secondary | ICD-10-CM | POA: Diagnosis not present

## 2020-05-15 DIAGNOSIS — H524 Presbyopia: Secondary | ICD-10-CM | POA: Diagnosis not present

## 2020-06-06 DIAGNOSIS — E890 Postprocedural hypothyroidism: Secondary | ICD-10-CM | POA: Diagnosis not present

## 2020-06-06 DIAGNOSIS — R5383 Other fatigue: Secondary | ICD-10-CM | POA: Diagnosis not present

## 2020-06-06 DIAGNOSIS — E7800 Pure hypercholesterolemia, unspecified: Secondary | ICD-10-CM | POA: Diagnosis not present

## 2020-06-13 DIAGNOSIS — H532 Diplopia: Secondary | ICD-10-CM | POA: Diagnosis not present

## 2020-06-13 DIAGNOSIS — E890 Postprocedural hypothyroidism: Secondary | ICD-10-CM | POA: Diagnosis not present

## 2020-06-13 DIAGNOSIS — M6281 Muscle weakness (generalized): Secondary | ICD-10-CM | POA: Diagnosis not present

## 2020-07-05 ENCOUNTER — Ambulatory Visit: Payer: BC Managed Care – PPO | Admitting: Cardiology

## 2020-07-05 ENCOUNTER — Encounter: Payer: Self-pay | Admitting: Cardiology

## 2020-07-05 ENCOUNTER — Other Ambulatory Visit: Payer: Self-pay

## 2020-07-05 VITALS — BP 140/84 | HR 84 | Temp 98.0°F | Resp 10 | Ht 71.0 in | Wt 231.0 lb

## 2020-07-05 DIAGNOSIS — R0609 Other forms of dyspnea: Secondary | ICD-10-CM

## 2020-07-05 DIAGNOSIS — R9431 Abnormal electrocardiogram [ECG] [EKG]: Secondary | ICD-10-CM

## 2020-07-05 DIAGNOSIS — M6281 Muscle weakness (generalized): Secondary | ICD-10-CM

## 2020-07-05 DIAGNOSIS — R252 Cramp and spasm: Secondary | ICD-10-CM

## 2020-07-05 DIAGNOSIS — I10 Essential (primary) hypertension: Secondary | ICD-10-CM | POA: Diagnosis not present

## 2020-07-05 DIAGNOSIS — Z87891 Personal history of nicotine dependence: Secondary | ICD-10-CM

## 2020-07-05 DIAGNOSIS — I739 Peripheral vascular disease, unspecified: Secondary | ICD-10-CM | POA: Diagnosis not present

## 2020-07-05 DIAGNOSIS — E890 Postprocedural hypothyroidism: Secondary | ICD-10-CM

## 2020-07-05 DIAGNOSIS — R0600 Dyspnea, unspecified: Secondary | ICD-10-CM

## 2020-07-05 DIAGNOSIS — Z8639 Personal history of other endocrine, nutritional and metabolic disease: Secondary | ICD-10-CM

## 2020-07-05 NOTE — Progress Notes (Signed)
Date:  07/05/2020   ID:  [name], DOB Jul 05, 1956, MRN [id number]  PCP:  Merri Brunette, MD  Cardiologist:  Tessa Lerner, DO, [facility] (established care 07/05/2020) Former Cardiology Providers: Dr. Donnie Aho  REASON FOR CONSULT: Muscle Weakness  REQUESTING PHYSICIAN:  Merri Brunette, MD [address] [place],  [place] [address]  Chief Complaint  Patient presents with  . New Patient (Initial Visit)  . Muscle Weakness    HPI  Troy Turner is a 64 y.o. male who presents to the office with a chief complaint of " muscle weakness." He is referred to the office at the request of Merri Brunette, MD. Patient's past medical history and cardiovascular risk factors include: History of Graves' disease, hypothyroidism, hypertension, anxiety, depression, former smoker, former excessive alcohol use, advanced age.  Patient is referred to the office at the request of his primary care provider for evaluation of muscle weakness.  Patient states that he has been having episodes of generalized muscle weakness for some time now.  Patient states that he is very active such as playing tennis at least twice a week without any effort related symptoms of chest pain or shortness of breath but towards the end of the game he starts having muscle cramps in the bilateral lower extremities.  In addition, when he goes to walk his dog for half a mile per day towards the end of the walk he starts having weakness above and below bilateral knees.  The pain usually improves with rest.  No known myopathies.  Patient has been on simvastatin for a long time according to the patient as well.  At times patient states that he does have effort related dyspnea if he goes up more than 2 flights of stairs but this is chronic and stable.  Denies prior history of coronary artery disease, myocardial infarction, congestive heart failure, deep venous thrombosis, pulmonary embolism, stroke, transient ischemic  attack.  FUNCTIONAL STATUS: Place tennis at least twice a week and goes out for a walk for half a mile per day with his dog  ALLERGIES: No Known Allergies  MEDICATION LIST PRIOR TO VISIT: Current Meds  Medication Sig  . amLODipine (NORVASC) 10 MG tablet Take 10 mg by mouth daily.  Marland Kitchen levothyroxine (SYNTHROID) 150 MCG tablet Take 150 mcg by mouth daily before breakfast.   . lisinopril-hydrochlorothiazide (ZESTORETIC) 20-12.5 MG tablet Take 1 tablet by mouth daily.  Marland Kitchen LORazepam (ATIVAN) 1 MG tablet Take 1 mg by mouth as needed.  . sertraline (ZOLOFT) 100 MG tablet Take 100 mg by mouth daily.  . simvastatin (ZOCOR) 10 MG tablet Take 10 mg by mouth daily.   . traZODone (DESYREL) 100 MG tablet Take 100 mg by mouth at bedtime.     PAST MEDICAL HISTORY: Past Medical History:  Diagnosis Date  . Anxiety   . Depression   . Erectile dysfunction   . History of Graves' disease   . History of kidney stones   . Hyperlipidemia   . Hypertension   . Hypothyroidism    history of graves disease  . Hypothyroidism   . Peyronie's disease     PAST SURGICAL HISTORY: Past Surgical History:  Procedure Laterality Date  . APPENDECTOMY    . EXTRACORPOREAL SHOCK WAVE LITHOTRIPSY Right 06/21/2018   Procedure: RIGHT EXTRACORPOREAL SHOCK WAVE LITHOTRIPSY (ESWL);  Surgeon: Sebastian Ache, MD;  Location: [facility];  Service: Urology;  Laterality: Right;  . HEMORRHOID SURGERY    . SHOULDER SURGERY    .  undesended testicle      FAMILY HISTORY: The patient family history is not on file.  SOCIAL HISTORY:  The patient  reports that he quit smoking about 17 years ago. He has never used smokeless tobacco. He reports that he does not use drugs.  REVIEW OF SYSTEMS: Review of Systems  Constitutional: Negative for chills and fever.  HENT: Negative for hoarse voice and nosebleeds.   Eyes: Negative for discharge, double vision and pain.  Cardiovascular: Positive for claudication and dyspnea on exertion.  Negative for chest pain, leg swelling, near-syncope, orthopnea, palpitations, paroxysmal nocturnal dyspnea and syncope.  Respiratory: Negative for hemoptysis and shortness of breath.   Musculoskeletal: Positive for muscle cramps, muscle weakness and myalgias.  Gastrointestinal: Negative for abdominal pain, constipation, diarrhea, hematemesis, hematochezia, melena, nausea and vomiting.  Neurological: Negative for dizziness and light-headedness.    PHYSICAL EXAM: Vitals with BMI 07/05/2020 06/21/2018 06/21/2018  Height 5\' 11"  - -  Weight 231 lbs - -  BMI 32.23 - -  Systolic 140 144  Diastolic 84 80 81  Pulse 84 55 66   CONSTITUTIONAL: Well-developed and well-nourished. No acute distress.  SKIN: Skin is warm and dry. No rash noted. No cyanosis. No pallor. No jaundice HEAD: Normocephalic and atraumatic.  EYES: No scleral icterus MOUTH/THROAT: Moist oral membranes.  NECK: No JVD present. No thyromegaly noted. No carotid bruits  LYMPHATIC: No visible cervical adenopathy.  CHEST Normal respiratory effort. No intercostal retractions  LUNGS: Clear to auscultation bilaterally.  No stridor. No wheezes. No rales.  CARDIOVASCULAR: Regular rate and rhythm, positive S1-S2, no murmurs rubs or gallops appreciated. ABDOMINAL: No apparent ascites.  EXTREMITIES: No peripheral edema.  2+ femoral pulses, bilateral popliteal pulses not well appreciated, 2+ bilateral dorsalis pedis and posterior tibial pulses. HEMATOLOGIC: No significant bruising NEUROLOGIC: Oriented to person, place, and time. Nonfocal. Normal muscle tone.  PSYCHIATRIC: Normal mood and affect. Normal behavior. Cooperative  CARDIAC DATABASE: EKG: 07/05/2020:Sinus  Rhythm, 77bpm, normal axis, consider old inferior infarct, no underlying ischemia or injury pattern.  Echocardiogram: None  Stress Testing: None  Heart Catheterization: None  LABORATORY DATA: No flowsheet data found.  CMP Latest Ref Rng & Units 06/18/2017   Creatinine 0.61 - 1.24 mg/dL 06/20/2017    Lipid Panel  No results found for: CHOL, TRIG, HDL, CHOLHDL, VLDL, LDLCALC, LDLDIRECT, LABVLDL  No components found for: NTPROBNP No results for input(s): PROBNP in the last 8760 hours. No results for input(s): TSH in the last 8760 hours.  BMP No results for input(s): NA, K, CL, CO2, GLUCOSE, BUN, CREATININE, CALCIUM, GFRNONAA, GFRAA in the last 8760 hours.  HEMOGLOBIN A1C No results found for: HGBA1C, MPG  External Labs: Collected: 06/14/2020 Total creatinine kinase 226  June 06, 2020: TSH 0.78  Lipid profile: Collected: 09/05/2019 Total cholesterol 184, triglycerides 95, HDL 54, LDL 111, non-HDL 130.  Serum creatinine 1.1 g/dL   IMPRESSION:    11/05/2019   1. Muscle weakness  M62.81 PCV AORTA DUPLEX    PCV LOWER ARTERIAL (BILATERAL)  2. Bilateral leg cramps  R25.2 PCV AORTA DUPLEX    PCV LOWER ARTERIAL (BILATERAL)  3. Claudication (HCC)  I73.9 PCV AORTA DUPLEX    PCV LOWER ARTERIAL (BILATERAL)  4. Nonspecific abnormal electrocardiogram (ECG) (EKG)  R94.31 PCV MYOCARDIAL PERFUSION WO LEXISCAN  5. Dyspnea on exertion  R06.00 PCV MYOCARDIAL PERFUSION WO LEXISCAN    PCV ECHOCARDIOGRAM COMPLETE  6. Benign hypertension  I10 EKG 12-Lead  7. 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 Former smoker  Z87.891      RECOMMENDATIONS: Troy Turner is a 64 y.o. male whose past medical history and cardiac risk factors include: History of Graves' disease, hypothyroidism, hypertension, anxiety, depression, former smoker, former excessive alcohol use, advanced age.  Bilateral leg cramps/claudication:  Patient symptoms of muscle weakness after or during physical exertion is suggestive of claudication.  Recommend lower extremity arterial duplex to evaluate for peripheral artery disease.  TSH levels are within normal limits.  In regards to cramping patient is recommended to consider over-the-counter magnesium  supplements and/or tonic water.  Patient has been on statin therapy for decades prior to the symptoms.  No prior history of underlying myopathies.  No known reversible causes.  Abnormal EKG and dyspnea on exertion:  Surface EKG concerning for possible old inferior infarct.  No prior EKGs available for comparison.  Patient states that his last stress test was approximately 4 to 5 years ago at an outside facility I do not have the records for review.  Exercise nuclear stress test to evaluate for reversible ischemia.  Benign essential hypertension: Currently managed per primary team.  Medications reconciled.   FINAL MEDICATION LIST END OF ENCOUNTER: No orders of the defined types were placed in this encounter.    Current Outpatient Medications:  .  amLODipine (NORVASC) 10 MG tablet, Take 10 mg by mouth daily., Disp: , Rfl:  .  levothyroxine (SYNTHROID) 150 MCG tablet, Take 150 mcg by mouth daily before breakfast. , Disp: , Rfl:  .  lisinopril-hydrochlorothiazide (ZESTORETIC) 20-12.5 MG tablet, Take 1 tablet by mouth daily., Disp: , Rfl:  .  LORazepam (ATIVAN) 1 MG tablet, Take 1 mg by mouth as needed., Disp: , Rfl:  .  sertraline (ZOLOFT) 100 MG tablet, Take 100 mg by mouth daily., Disp: , Rfl:  .  simvastatin (ZOCOR) 10 MG tablet, Take 10 mg by mouth daily. , Disp: , Rfl:  .  traZODone (DESYREL) 100 MG tablet, Take 100 mg by mouth at bedtime., Disp: , Rfl:  .  ondansetron (ZOFRAN) 8 MG tablet, Take by mouth every 8 (eight) hours as needed for nausea or vomiting. (Patient not taking: Reported on 07/05/2020), Disp: , Rfl:  .  oxyCODONE-acetaminophen (PERCOCET/ROXICET) 5-325 MG tablet, Take 1-2 tablets by mouth every 6 (six) hours as needed for moderate pain or severe pain. After lithotripsy (Patient not taking: Reported on 07/05/2020), Disp: 20 tablet, Rfl: 0 .  tamsulosin (FLOMAX) 0.4 MG CAPS capsule, Take 1 capsule (0.4 mg total) by mouth daily. To help pass stone fragments (Patient not  taking: Reported on 07/05/2020), Disp: 30 capsule, Rfl: 0  Orders Placed This Encounter  Procedures  . PCV MYOCARDIAL PERFUSION WO LEXISCAN  . EKG 12-Lead  . PCV ECHOCARDIOGRAM COMPLETE  . PCV AORTA DUPLEX  . PCV LOWER ARTERIAL (BILATERAL)    There are no Patient Instructions on file for this visit.   --Continue cardiac medications as reconciled in final medication list. --Return in about 4 weeks (around 08/02/2020) for re-evaluation of symptoms., review test results.. Or sooner if needed. --Continue follow-up with your primary care physician regarding the management of your other chronic comorbid conditions.  Patient's questions and concerns were addressed to his satisfaction. He voices understanding of the instructions provided during this encounter.   This note was created using a voice recognition software as a result there may be grammatical errors inadvertently enclosed that do not reflect the nature of this encounter. Every attempt is  made to correct such errors.  Rex Kras, [place], [facility]  Pager: [phone] Office: [phone]

## 2020-07-16 ENCOUNTER — Other Ambulatory Visit: Payer: Self-pay

## 2020-07-16 ENCOUNTER — Ambulatory Visit: Payer: BC Managed Care – PPO

## 2020-07-16 DIAGNOSIS — R9431 Abnormal electrocardiogram [ECG] [EKG]: Secondary | ICD-10-CM

## 2020-07-16 DIAGNOSIS — R0609 Other forms of dyspnea: Secondary | ICD-10-CM

## 2020-07-16 DIAGNOSIS — R0600 Dyspnea, unspecified: Secondary | ICD-10-CM

## 2020-07-19 ENCOUNTER — Ambulatory Visit: Payer: BC Managed Care – PPO

## 2020-07-19 ENCOUNTER — Other Ambulatory Visit: Payer: Self-pay

## 2020-07-19 DIAGNOSIS — R0609 Other forms of dyspnea: Secondary | ICD-10-CM

## 2020-07-19 DIAGNOSIS — R0600 Dyspnea, unspecified: Secondary | ICD-10-CM | POA: Diagnosis not present

## 2020-07-19 DIAGNOSIS — M6281 Muscle weakness (generalized): Secondary | ICD-10-CM

## 2020-07-19 DIAGNOSIS — I739 Peripheral vascular disease, unspecified: Secondary | ICD-10-CM

## 2020-07-19 DIAGNOSIS — R252 Cramp and spasm: Secondary | ICD-10-CM

## 2020-07-24 ENCOUNTER — Telehealth: Payer: Self-pay

## 2020-07-24 NOTE — Telephone Encounter (Signed)
Called pt to inform him about his stress test results. Pt will be return to the office tomorrow. Pt understood

## 2020-07-24 NOTE — Telephone Encounter (Signed)
-----   Message from [place], [place] sent at 07/23/2020 11:46 PM EDT ----- Please inform the patient that his recent nuclear stress test was reported to be intermediate risk study with partial reversible perfusion defect.  Please move up his follow-up appointment to this week.The other details of the report will be discussed at the next office visit.  If your symptoms have increased in intensity, frequency, duration or new symptoms suggestive of typical chest pain as discussed during last office visit please still seek medical attention at the closest ER via EMS.

## 2020-07-25 ENCOUNTER — Ambulatory Visit: Payer: BC Managed Care – PPO | Admitting: Cardiology

## 2020-07-25 ENCOUNTER — Encounter: Payer: Self-pay | Admitting: Cardiology

## 2020-07-25 ENCOUNTER — Other Ambulatory Visit: Payer: Self-pay

## 2020-07-25 VITALS — BP 125/79 | HR 77 | Resp 15 | Ht 71.0 in | Wt 225.0 lb

## 2020-07-25 DIAGNOSIS — I10 Essential (primary) hypertension: Secondary | ICD-10-CM

## 2020-07-25 DIAGNOSIS — R0609 Other forms of dyspnea: Secondary | ICD-10-CM

## 2020-07-25 DIAGNOSIS — Z712 Person consulting for explanation of examination or test findings: Secondary | ICD-10-CM

## 2020-07-25 DIAGNOSIS — Z8639 Personal history of other endocrine, nutritional and metabolic disease: Secondary | ICD-10-CM | POA: Diagnosis not present

## 2020-07-25 DIAGNOSIS — Z87891 Personal history of nicotine dependence: Secondary | ICD-10-CM

## 2020-07-25 DIAGNOSIS — R9439 Abnormal result of other cardiovascular function study: Secondary | ICD-10-CM | POA: Diagnosis not present

## 2020-07-25 DIAGNOSIS — E890 Postprocedural hypothyroidism: Secondary | ICD-10-CM

## 2020-07-25 DIAGNOSIS — R0600 Dyspnea, unspecified: Secondary | ICD-10-CM | POA: Diagnosis not present

## 2020-07-25 MED ORDER — NITROGLYCERIN 0.4 MG SL SUBL: 0.4000 mg | SUBLINGUAL_TABLET | SUBLINGUAL | 0 refills | Status: AC | PRN

## 2020-07-25 NOTE — H&P (View-Only) (Signed)
Date:  07/25/2020   ID:  Troy Turner, DOB 1956/11/22, MRN [id number]  PCP:  Merri Brunette, MD  Cardiologist:  Tessa Lerner, DO, [facility] (established care 07/05/2020) Former Cardiology Providers: Dr. Donnie Aho  Date: 07/25/20 Last Office Visit: 07/05/2020  Chief Complaint  Patient presents with  . Follow-up    4 week  . Results    HPI  Troy Turner is a 64 y.o. male who presents to the office with a chief complaint of " review test results." Patient's past medical history and cardiovascular risk factors include: History of Graves' disease, hypothyroidism, hypertension, anxiety, depression, former smoker, former excessive alcohol use, advanced age.  Patient is referred to the office at the request of his primary care provider for evaluation of muscle weakness.  Patient was originally referred to the office for evaluation of muscle weakness usually after playing tennis he would have muscle cramps in bilateral lower extremities.  He also noted that when he goes walks his dog for half a mile a day towards the end of the walk he starts having weakness in bilateral legs above and below the knee.  The pain usually improves with rest.  There is concern for underlying peripheral vascular disease and therefore he was recommended to undergo ankle-brachial index and lower extremity arterial duplex.  Ankle-brachial index is within normal limits and no significant stenosis noted on arterial duplex.  He does have mild plaque in the infrapopliteal region.  He is currently on statin therapy.  He is asked to follow-up with his primary for possible noncardiac causes of muscle weakness.  During last office visit he also noted that he has been experiencing effort related dyspnea at times when he goes up 2 flights of stairs.  The symptoms are nonprogressive and because of multiple cardiovascular risk factors and EKG findings he was recommended to undergo ischemic evaluation.  Echo notes a preserved left  ventricular systolic function, grade 1 diastolic impairment, no significant valvular heart disease and patient is noted to have a small sized severe intensity partially reversible defect in the apical anteroseptal segments suggestive of ischemia in the LAD distribution.  In addition during the exercise portion he was having arm and leg heaviness and exertional dyspnea.  I reviewed the stress test images independently with the patient at today's office visit and explained to him the findings.  No known myopathies.  Patient has been on simvastatin for a long time according to the patient as well.  Denies prior history of coronary artery disease, myocardial infarction, congestive heart failure, deep venous thrombosis, pulmonary embolism, stroke, transient ischemic attack.  FUNCTIONAL STATUS: Place tennis at least twice a week and goes out for a walk for half a mile per day with his dog  ALLERGIES: No Known Allergies  MEDICATION LIST PRIOR TO VISIT: Current Meds  Medication Sig  . amLODipine (NORVASC) 10 MG tablet Take 10 mg by mouth daily.  Marland Kitchen aspirin 81 MG chewable tablet Chew 81 mg by mouth daily.  Marland Kitchen levothyroxine (SYNTHROID) 150 MCG tablet Take 150 mcg by mouth daily before breakfast.   . lisinopril-hydrochlorothiazide (ZESTORETIC) 20-12.5 MG tablet Take 1 tablet by mouth daily.  Marland Kitchen LORazepam (ATIVAN) 1 MG tablet Take 1 mg by mouth as needed.  . sertraline (ZOLOFT) 100 MG tablet Take 100 mg by mouth daily.  . simvastatin (ZOCOR) 10 MG tablet Take 10 mg by mouth daily.   . traZODone (DESYREL) 100 MG tablet Take 100 mg by mouth at bedtime.  PAST MEDICAL HISTORY: Past Medical History:  Diagnosis Date  . Anxiety   . Depression   . Erectile dysfunction   . History of Graves' disease   . History of kidney stones   . Hyperlipidemia   . Hypertension   . Hypothyroidism    history of graves disease  . Hypothyroidism   . Peyronie's disease     PAST SURGICAL HISTORY: Past Surgical  History:  Procedure Laterality Date  . APPENDECTOMY    . EXTRACORPOREAL SHOCK WAVE LITHOTRIPSY Right 06/21/2018   Procedure: RIGHT EXTRACORPOREAL SHOCK WAVE LITHOTRIPSY (ESWL);  Surgeon: Sebastian Ache, MD;  Location: [facility];  Service: Urology;  Laterality: Right;  . HEMORRHOID SURGERY    . SHOULDER SURGERY    . undesended testicle      FAMILY HISTORY: The patient family history includes Dementia in his father; Heart failure in his brother; Hydrocephalus in his brother; Parkinson's disease in his mother; Sleep apnea in his brother.  SOCIAL HISTORY:  The patient  reports that he quit smoking about 17 years ago. He has never used smokeless tobacco. He reports previous alcohol use. He reports that he does not use drugs.  REVIEW OF SYSTEMS: Review of Systems  Constitutional: Negative for chills and fever.  HENT: Negative for hoarse voice and nosebleeds.   Eyes: Negative for discharge, double vision and pain.  Cardiovascular: Positive for dyspnea on exertion. Negative for chest pain, claudication, leg swelling, near-syncope, orthopnea, palpitations, paroxysmal nocturnal dyspnea and syncope.  Respiratory: Negative for hemoptysis and shortness of breath.   Musculoskeletal: Positive for muscle cramps, muscle weakness and myalgias.  Gastrointestinal: Negative for abdominal pain, constipation, diarrhea, hematemesis, hematochezia, melena, nausea and vomiting.  Neurological: Negative for dizziness and light-headedness.    PHYSICAL EXAM: Vitals with BMI 07/25/2020 07/05/2020 06/21/2018  Height 5\' 11"  5\' 11"  -  Weight 225 lbs 231 lbs -  BMI 31.39 32.23 -  Systolic 125 140  Diastolic 79 84 80  Pulse 77 84 55   CONSTITUTIONAL: Well-developed and well-nourished. No acute distress.  SKIN: Skin is warm and dry. No rash noted. No cyanosis. No pallor. No jaundice HEAD: Normocephalic and atraumatic.  EYES: No scleral icterus MOUTH/THROAT: Moist oral membranes.  NECK: No JVD present. No  thyromegaly noted. No carotid bruits  LYMPHATIC: No visible cervical adenopathy.  CHEST Normal respiratory effort. No intercostal retractions  LUNGS: Clear to auscultation bilaterally.  No stridor. No wheezes. No rales.  CARDIOVASCULAR: Regular rate and rhythm, positive S1-S2, no murmurs rubs or gallops appreciated. ABDOMINAL: No apparent ascites.  EXTREMITIES: No peripheral edema.  2+ femoral pulses, bilateral popliteal pulses not well appreciated, 2+ bilateral dorsalis pedis and posterior tibial pulses. HEMATOLOGIC: No significant bruising NEUROLOGIC: Oriented to person, place, and time. Nonfocal. Normal muscle tone.  PSYCHIATRIC: Normal mood and affect. Normal behavior. Cooperative  CARDIAC DATABASE: EKG: 07/05/2020:Sinus  Rhythm, 77bpm, normal axis, consider old inferior infarct, no underlying ischemia or injury pattern.  Echocardiogram: 07/19/2020:  Left ventricle cavity is normal in size. Moderate concentric hypertrophy of the left ventricle. Normal global wall motion. Normal LV systolic function with EF 55%. Doppler evidence of grade I (impaired) diastolic dysfunction, normal LAP.  Aneurysmal interatrial septum without 2D or color Doppler evidence of interatrial shunt.  No signifiant valvular abnormality.   Stress Testing: Lexiscan/modified Bruce Tetrofosmin stress test 07/17/2020: Lexiscan/modified Bruce nuclear stress test performed using 1-day protocol. No chest pain reported. Exertional dyspnea, "arm and leg heaviness" reported. Stress EKG is non-diagnostic, as this is pharmacological stress test. In  addition, stress EKG at 86% MPHR showed sinus tachycardia, possible old inferior infarct, no ischemic changes. SPECT images show small size, severe intensity, partially reversible perfusion defect in apical anteroseptal myocardium. Stress LVEF 78%. Intermediate risk study.  Heart Catheterization: None  Lower Extremity Arterial Duplex 07/19/2020:  No hemodynamically significant  stenoses are identified in the right lower extremity arterial system. No hemodynamically significant stenoses are identified in the left lower extremity arterial system.  This exam reveals normal perfusion of the right and left lower extremity (ABI 1.00). Dampened flow in the left anterior tibial artery with mild plaque noted.  Evaluate patient for non-vascular pseud-oclaudication.  LABORATORY DATA: No flowsheet data found.  CMP Latest Ref Rng & Units 06/18/2017  Creatinine 0.61 - 1.24 mg/dL 1.610.90    Lipid Panel  No results found for: CHOL, TRIG, HDL, CHOLHDL, VLDL, LDLCALC, LDLDIRECT, LABVLDL  No components found for: NTPROBNP No results for input(s): PROBNP in the last 8760 hours. No results for input(s): TSH in the last 8760 hours.  BMP No results for input(s): NA, K, CL, CO2, GLUCOSE, BUN, CREATININE, CALCIUM, GFRNONAA, GFRAA in the last 8760 hours.  HEMOGLOBIN A1C No results found for: HGBA1C, MPG  External Labs: Collected: 06/14/2020 Total creatinine kinase 226  June 06, 2020: TSH 0.78  Lipid profile: Collected: 09/05/2019 Total cholesterol 184, triglycerides 95, HDL 54, LDL 111, non-HDL 130.  Serum creatinine 1.1 g/dL   IMPRESSION:    [id number]-CM   1. Abnormal stress test  R94.39 nitroGLYCERIN (NITROSTAT) 0.4 MG SL tablet    Basic metabolic panel    Magnesium    CBC    SARS-COV-2 RNA,(COVID-19) QUAL NAAT  2. Dyspnea on exertion  R06.00 nitroGLYCERIN (NITROSTAT) 0.4 MG SL tablet    Basic metabolic panel    Magnesium    CBC    SARS-COV-2 RNA,(COVID-19) QUAL NAAT  3. Benign hypertension  I10   4. 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 Former smoker  Z87.891      RECOMMENDATIONS: Troy Turner is a 64 y.o. male whose past medical history and cardiac risk factors include: History of Graves' disease, hypothyroidism, hypertension, anxiety, depression, former smoker, former excessive alcohol use, advanced age.  Abnormal  nuclear stress test:  Reviewed the most recent Lexiscan stress test with the patient at today's office visit.  The images were independently reviewed as discussed above.  Recommended either left heart catheterization or coronary CTA for further risk stratification for underlying CAD.  Patient would like to proceed with left heart catheterization.  The left heart catheterization procedure was explained to the patient in detail. The indication, alternatives, risks and benefits were reviewed. Complications including but not limited to bleeding, infection, acute kidney injury, blood transfusion, heart rhythm disturbances, contrast (dye) reaction, damage to the arteries or nerves in the legs or hands, cerebrovascular accident, myocardial infarction, need for emergent bypass surgery, blood clots in the legs, possible need for emergent blood transfusion, and rarely death were reviewed and discussed with the patient. The patient voices understanding and wishes to proceed.   Check CBC, BMP, and COVID-19 screening.  Bilateral leg cramps:  Ankle-brachial index within normal limits.  No significant stenosis noted in bilateral lower extremities.  Infrapopliteal disease noted on arterial duplex.  Patient is currently on statin therapy.    Patient also started aspirin 81 mg p.o. daily.    Educated on continuing to walk or exercise on a daily basis as he is already  doing so.    Encouraged importance of improving his modifiable cardiovascular risk factors.    Will defer further work-up to his PCP in regards to none cardiac causes of bilateral leg cramps.  Benign essential hypertension: Currently managed per primary team.  Medications reconciled.  I independently reviewed the reports of the echocardiogram, stress test including images, and arterial duplex results with the patient at today's office visit.  FINAL MEDICATION LIST END OF ENCOUNTER: Meds ordered this encounter  Medications  . nitroGLYCERIN  (NITROSTAT) 0.4 MG SL tablet    Sig: Place 1 tablet (0.4 mg total) under the tongue every 5 (five) minutes as needed for chest pain.    Dispense:  30 tablet    Refill:  0     Current Outpatient Medications:  .  amLODipine (NORVASC) 10 MG tablet, Take 10 mg by mouth daily., Disp: , Rfl:  .  aspirin 81 MG chewable tablet, Chew 81 mg by mouth daily., Disp: , Rfl:  .  levothyroxine (SYNTHROID) 150 MCG tablet, Take 150 mcg by mouth daily before breakfast. , Disp: , Rfl:  .  lisinopril-hydrochlorothiazide (ZESTORETIC) 20-12.5 MG tablet, Take 1 tablet by mouth daily., Disp: , Rfl:  .  LORazepam (ATIVAN) 1 MG tablet, Take 1 mg by mouth as needed., Disp: , Rfl:  .  sertraline (ZOLOFT) 100 MG tablet, Take 100 mg by mouth daily., Disp: , Rfl:  .  simvastatin (ZOCOR) 10 MG tablet, Take 10 mg by mouth daily. , Disp: , Rfl:  .  traZODone (DESYREL) 100 MG tablet, Take 100 mg by mouth at bedtime., Disp: , Rfl:  .  nitroGLYCERIN (NITROSTAT) 0.4 MG SL tablet, Place 1 tablet (0.4 mg total) under the tongue every 5 (five) minutes as needed for chest pain., Disp: 30 tablet, Rfl: 0  Orders Placed This Encounter  Procedures  . SARS-COV-2 RNA,(COVID-19) QUAL NAAT  . Basic metabolic panel  . Magnesium  . CBC    There are no Patient Instructions on file for this visit.   --Continue cardiac medications as reconciled in final medication list. --Return in about 2 weeks (around 08/08/2020) for post heart catheterization  follow up.. Or sooner if needed. --Continue follow-up with your primary care physician regarding the management of your other chronic comorbid conditions.  Patient's questions and concerns were addressed to his satisfaction. He voices understanding of the instructions provided during this encounter.   This note was created using a voice recognition software as a result there may be grammatical errors inadvertently enclosed that do not reflect the nature of this encounter. Every attempt is made to  correct such errors.   Tessa Lerner, [place], [facility]  Pager: [phone] Office: [phone]

## 2020-07-25 NOTE — Progress Notes (Signed)
 Date:  07/25/2020   ID:  Troy Turner, DOB 04/06/1956, MRN [id number]  PCP:  Pharr, Walter, MD  Cardiologist:  Divine Hansley, DO, FACC (established care 07/05/2020) Former Cardiology Providers: Dr. Tilley  Date: 07/25/20 Last Office Visit: 07/05/2020  Chief Complaint  Patient presents with  . Follow-up    4 week  . Results    HPI  Troy Turner is a 64 y.o. male who presents to the office with a chief complaint of " review test results." Patient's past medical history and cardiovascular risk factors include: History of Graves' disease, hypothyroidism, hypertension, anxiety, depression, former smoker, former excessive alcohol use, advanced age.  Patient is referred to the office at the request of his primary care provider for evaluation of muscle weakness.  Patient was originally referred to the office for evaluation of muscle weakness usually after playing tennis he would have muscle cramps in bilateral lower extremities.  He also noted that when he goes walks his dog for half a mile a day towards the end of the walk he starts having weakness in bilateral legs above and below the knee.  The pain usually improves with rest.  There is concern for underlying peripheral vascular disease and therefore he was recommended to undergo ankle-brachial index and lower extremity arterial duplex.  Ankle-brachial index is within normal limits and no significant stenosis noted on arterial duplex.  He does have mild plaque in the infrapopliteal region.  He is currently on statin therapy.  He is asked to follow-up with his primary for possible noncardiac causes of muscle weakness.  During last office visit he also noted that he has been experiencing effort related dyspnea at times when he goes up 2 flights of stairs.  The symptoms are nonprogressive and because of multiple cardiovascular risk factors and EKG findings he was recommended to undergo ischemic evaluation.  Echo notes a preserved left  ventricular systolic function, grade 1 diastolic impairment, no significant valvular heart disease and patient is noted to have a small sized severe intensity partially reversible defect in the apical anteroseptal segments suggestive of ischemia in the LAD distribution.  In addition during the exercise portion he was having arm and leg heaviness and exertional dyspnea.  I reviewed the stress test images independently with the patient at today's office visit and explained to him the findings.  No known myopathies.  Patient has been on simvastatin for a long time according to the patient as well.  Denies prior history of coronary artery disease, myocardial infarction, congestive heart failure, deep venous thrombosis, pulmonary embolism, stroke, transient ischemic attack.  FUNCTIONAL STATUS: Place tennis at least twice a week and goes out for a walk for half a mile per day with his dog  ALLERGIES: No Known Allergies  MEDICATION LIST PRIOR TO VISIT: Current Meds  Medication Sig  . amLODipine (NORVASC) 10 MG tablet Take 10 mg by mouth daily.  . aspirin 81 MG chewable tablet Chew 81 mg by mouth daily.  . levothyroxine (SYNTHROID) 150 MCG tablet Take 150 mcg by mouth daily before breakfast.   . lisinopril-hydrochlorothiazide (ZESTORETIC) 20-12.5 MG tablet Take 1 tablet by mouth daily.  . LORazepam (ATIVAN) 1 MG tablet Take 1 mg by mouth as needed.  . sertraline (ZOLOFT) 100 MG tablet Take 100 mg by mouth daily.  . simvastatin (ZOCOR) 10 MG tablet Take 10 mg by mouth daily.   . traZODone (DESYREL) 100 MG tablet Take 100 mg by mouth at bedtime.       PAST MEDICAL HISTORY: Past Medical History:  Diagnosis Date  . Anxiety   . Depression   . Erectile dysfunction   . History of Graves' disease   . History of kidney stones   . Hyperlipidemia   . Hypertension   . Hypothyroidism    history of graves disease  . Hypothyroidism   . Peyronie's disease     PAST SURGICAL HISTORY: Past Surgical  History:  Procedure Laterality Date  . APPENDECTOMY    . EXTRACORPOREAL SHOCK WAVE LITHOTRIPSY Right 06/21/2018   Procedure: RIGHT EXTRACORPOREAL SHOCK WAVE LITHOTRIPSY (ESWL);  Surgeon: Manny, Theodore, MD;  Location: [facility];  Service: Urology;  Laterality: Right;  . HEMORRHOID SURGERY    . SHOULDER SURGERY    . undesended testicle      FAMILY HISTORY: The patient family history includes Dementia in his father; Heart failure in his brother; Hydrocephalus in his brother; Parkinson's disease in his mother; Sleep apnea in his brother.  SOCIAL HISTORY:  The patient  reports that he quit smoking about 17 years ago. He has never used smokeless tobacco. He reports previous alcohol use. He reports that he does not use drugs.  REVIEW OF SYSTEMS: Review of Systems  Constitutional: Negative for chills and fever.  HENT: Negative for hoarse voice and nosebleeds.   Eyes: Negative for discharge, double vision and pain.  Cardiovascular: Positive for dyspnea on exertion. Negative for chest pain, claudication, leg swelling, near-syncope, orthopnea, palpitations, paroxysmal nocturnal dyspnea and syncope.  Respiratory: Negative for hemoptysis and shortness of breath.   Musculoskeletal: Positive for muscle cramps, muscle weakness and myalgias.  Gastrointestinal: Negative for abdominal pain, constipation, diarrhea, hematemesis, hematochezia, melena, nausea and vomiting.  Neurological: Negative for dizziness and light-headedness.    PHYSICAL EXAM: Vitals with BMI 07/25/2020 07/05/2020 06/21/2018  Height 5' 11" 5' 11" -  Weight 225 lbs 231 lbs -  BMI 31.39 32.23 -  Systolic 125 140 144  Diastolic 79 84 80  Pulse 77 84 55   CONSTITUTIONAL: Well-developed and well-nourished. No acute distress.  SKIN: Skin is warm and dry. No rash noted. No cyanosis. No pallor. No jaundice HEAD: Normocephalic and atraumatic.  EYES: No scleral icterus MOUTH/THROAT: Moist oral membranes.  NECK: No JVD present. No  thyromegaly noted. No carotid bruits  LYMPHATIC: No visible cervical adenopathy.  CHEST Normal respiratory effort. No intercostal retractions  LUNGS: Clear to auscultation bilaterally.  No stridor. No wheezes. No rales.  CARDIOVASCULAR: Regular rate and rhythm, positive S1-S2, no murmurs rubs or gallops appreciated. ABDOMINAL: No apparent ascites.  EXTREMITIES: No peripheral edema.  2+ femoral pulses, bilateral popliteal pulses not well appreciated, 2+ bilateral dorsalis pedis and posterior tibial pulses. HEMATOLOGIC: No significant bruising NEUROLOGIC: Oriented to person, place, and time. Nonfocal. Normal muscle tone.  PSYCHIATRIC: Normal mood and affect. Normal behavior. Cooperative  CARDIAC DATABASE: EKG: 07/05/2020:Sinus  Rhythm, 77bpm, normal axis, consider old inferior infarct, no underlying ischemia or injury pattern.  Echocardiogram: 07/19/2020:  Left ventricle cavity is normal in size. Moderate concentric hypertrophy of the left ventricle. Normal global wall motion. Normal LV systolic function with EF 55%. Doppler evidence of grade I (impaired) diastolic dysfunction, normal LAP.  Aneurysmal interatrial septum without 2D or color Doppler evidence of interatrial shunt.  No signifiant valvular abnormality.   Stress Testing: Lexiscan/modified Bruce Tetrofosmin stress test 07/17/2020: Lexiscan/modified Bruce nuclear stress test performed using 1-day protocol. No chest pain reported. Exertional dyspnea, "arm and leg heaviness" reported. Stress EKG is non-diagnostic, as this is pharmacological stress test. In   addition, stress EKG at 86% MPHR showed sinus tachycardia, possible old inferior infarct, no ischemic changes. SPECT images show small size, severe intensity, partially reversible perfusion defect in apical anteroseptal myocardium. Stress LVEF 78%. Intermediate risk study.  Heart Catheterization: None  Lower Extremity Arterial Duplex 07/19/2020:  No hemodynamically significant  stenoses are identified in the right lower extremity arterial system. No hemodynamically significant stenoses are identified in the left lower extremity arterial system.  This exam reveals normal perfusion of the right and left lower extremity (ABI 1.00). Dampened flow in the left anterior tibial artery with mild plaque noted.  Evaluate patient for non-vascular pseud-oclaudication.  LABORATORY DATA: No flowsheet data found.  CMP Latest Ref Rng & Units 06/18/2017  Creatinine 0.61 - 1.24 mg/dL 0.90    Lipid Panel  No results found for: CHOL, TRIG, HDL, CHOLHDL, VLDL, LDLCALC, LDLDIRECT, LABVLDL  No components found for: NTPROBNP No results for input(s): PROBNP in the last 8760 hours. No results for input(s): TSH in the last 8760 hours.  BMP No results for input(s): NA, K, CL, CO2, GLUCOSE, BUN, CREATININE, CALCIUM, GFRNONAA, GFRAA in the last 8760 hours.  HEMOGLOBIN A1C No results found for: HGBA1C, MPG  External Labs: Collected: 06/14/2020 Total creatinine kinase 226  June 06, 2020: TSH 0.78  Lipid profile: Collected: 09/05/2019 Total cholesterol 184, triglycerides 95, HDL 54, LDL 111, non-HDL 130.  Serum creatinine 1.1 g/dL   IMPRESSION:    ICD-10-CM   1. Abnormal stress test  R94.39 nitroGLYCERIN (NITROSTAT) 0.4 MG SL tablet    Basic metabolic panel    Magnesium    CBC    SARS-COV-2 RNA,(COVID-19) QUAL NAAT  2. Dyspnea on exertion  R06.00 nitroGLYCERIN (NITROSTAT) 0.4 MG SL tablet    Basic metabolic panel    Magnesium    CBC    SARS-COV-2 RNA,(COVID-19) QUAL NAAT  3. Benign hypertension  I10   4. 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 Former smoker  Z87.891      RECOMMENDATIONS: Troy Turner is a 63 y.o. male whose past medical history and cardiac risk factors include: History of Graves' disease, hypothyroidism, hypertension, anxiety, depression, former smoker, former excessive alcohol use, advanced age.  Abnormal  nuclear stress test:  Reviewed the most recent Lexiscan stress test with the patient at today's office visit.  The images were independently reviewed as discussed above.  Recommended either left heart catheterization or coronary CTA for further risk stratification for underlying CAD.  Patient would like to proceed with left heart catheterization.  The left heart catheterization procedure was explained to the patient in detail. The indication, alternatives, risks and benefits were reviewed. Complications including but not limited to bleeding, infection, acute kidney injury, blood transfusion, heart rhythm disturbances, contrast (dye) reaction, damage to the arteries or nerves in the legs or hands, cerebrovascular accident, myocardial infarction, need for emergent bypass surgery, blood clots in the legs, possible need for emergent blood transfusion, and rarely death were reviewed and discussed with the patient. The patient voices understanding and wishes to proceed.   Check CBC, BMP, and COVID-19 screening.  Bilateral leg cramps:  Ankle-brachial index within normal limits.  No significant stenosis noted in bilateral lower extremities.  Infrapopliteal disease noted on arterial duplex.  Patient is currently on statin therapy.    Patient also started aspirin 81 mg p.o. daily.    Educated on continuing to walk or exercise on a daily basis as he is already   doing so.    Encouraged importance of improving his modifiable cardiovascular risk factors.    Will defer further work-up to his PCP in regards to none cardiac causes of bilateral leg cramps.  Benign essential hypertension: Currently managed per primary team.  Medications reconciled.  I independently reviewed the reports of the echocardiogram, stress test including images, and arterial duplex results with the patient at today's office visit.  FINAL MEDICATION LIST END OF ENCOUNTER: Meds ordered this encounter  Medications  . nitroGLYCERIN  (NITROSTAT) 0.4 MG SL tablet    Sig: Place 1 tablet (0.4 mg total) under the tongue every 5 (five) minutes as needed for chest pain.    Dispense:  30 tablet    Refill:  0     Current Outpatient Medications:  .  amLODipine (NORVASC) 10 MG tablet, Take 10 mg by mouth daily., Disp: , Rfl:  .  aspirin 81 MG chewable tablet, Chew 81 mg by mouth daily., Disp: , Rfl:  .  levothyroxine (SYNTHROID) 150 MCG tablet, Take 150 mcg by mouth daily before breakfast. , Disp: , Rfl:  .  lisinopril-hydrochlorothiazide (ZESTORETIC) 20-12.5 MG tablet, Take 1 tablet by mouth daily., Disp: , Rfl:  .  LORazepam (ATIVAN) 1 MG tablet, Take 1 mg by mouth as needed., Disp: , Rfl:  .  sertraline (ZOLOFT) 100 MG tablet, Take 100 mg by mouth daily., Disp: , Rfl:  .  simvastatin (ZOCOR) 10 MG tablet, Take 10 mg by mouth daily. , Disp: , Rfl:  .  traZODone (DESYREL) 100 MG tablet, Take 100 mg by mouth at bedtime., Disp: , Rfl:  .  nitroGLYCERIN (NITROSTAT) 0.4 MG SL tablet, Place 1 tablet (0.4 mg total) under the tongue every 5 (five) minutes as needed for chest pain., Disp: 30 tablet, Rfl: 0  Orders Placed This Encounter  Procedures  . SARS-COV-2 RNA,(COVID-19) QUAL NAAT  . Basic metabolic panel  . Magnesium  . CBC    There are no Patient Instructions on file for this visit.   --Continue cardiac medications as reconciled in final medication list. --Return in about 2 weeks (around 08/08/2020) for post heart catheterization  follow up.. Or sooner if needed. --Continue follow-up with your primary care physician regarding the management of your other chronic comorbid conditions.  Patient's questions and concerns were addressed to his satisfaction. He voices understanding of the instructions provided during this encounter.   This note was created using a voice recognition software as a result there may be grammatical errors inadvertently enclosed that do not reflect the nature of this encounter. Every attempt is made to  correct such errors.   Troy Taglieri, DO, FACC  Pager: [phone] Office: [phone]   

## 2020-07-26 LAB — CBC
Hematocrit: 45 % (ref 37.5–51.0)
Hemoglobin: 15.8 g/dL (ref 13.0–17.7)
MCH: 31.7 pg (ref 26.6–33.0)
MCHC: 35.1 g/dL (ref 31.5–35.7)
MCV: 90 fL (ref 79–97)
Platelets: 244 10*3/uL (ref 150–450)
RBC: 4.99 x10E6/uL (ref 4.14–5.80)
RDW: 12.5 % (ref 11.6–15.4)
WBC: 8.7 10*3/uL (ref 3.4–10.8)

## 2020-08-02 NOTE — Telephone Encounter (Signed)
Mostly the right wrist.

## 2020-08-03 ENCOUNTER — Other Ambulatory Visit (HOSPITAL_COMMUNITY)
Admission: RE | Admit: 2020-08-03 | Discharge: 2020-08-03 | Disposition: A | Discharge status: Home / Self Care | Payer: BC Managed Care – PPO | Source: Ambulatory Visit | Attending: Cardiology | Admitting: Cardiology

## 2020-08-03 DIAGNOSIS — Z20822 Contact with and (suspected) exposure to covid-19: Secondary | ICD-10-CM | POA: Insufficient documentation

## 2020-08-03 DIAGNOSIS — Z01812 Encounter for preprocedural laboratory examination: Secondary | ICD-10-CM | POA: Diagnosis not present

## 2020-08-03 LAB — SARS CORONAVIRUS 2 (TAT 6-24 HRS): SARS Coronavirus 2: NEGATIVE

## 2020-08-07 ENCOUNTER — Other Ambulatory Visit: Payer: Self-pay

## 2020-08-07 ENCOUNTER — Ambulatory Visit: Payer: Self-pay | Admitting: Cardiology

## 2020-08-07 ENCOUNTER — Ambulatory Visit (HOSPITAL_COMMUNITY)
Admission: RE | Admit: 2020-08-07 | Discharge: 2020-08-07 | Disposition: A | Discharge status: Home / Self Care | Payer: BC Managed Care – PPO | Attending: Cardiology | Admitting: Cardiology

## 2020-08-07 ENCOUNTER — Encounter (HOSPITAL_COMMUNITY)
Admission: RE | Disposition: A | Discharge status: Home / Self Care | Payer: Self-pay | Source: Home / Self Care | Attending: Cardiology

## 2020-08-07 ENCOUNTER — Encounter (HOSPITAL_COMMUNITY): Payer: Self-pay | Admitting: Cardiology

## 2020-08-07 DIAGNOSIS — I10 Essential (primary) hypertension: Secondary | ICD-10-CM | POA: Insufficient documentation

## 2020-08-07 DIAGNOSIS — R9439 Abnormal result of other cardiovascular function study: Secondary | ICD-10-CM | POA: Diagnosis not present

## 2020-08-07 DIAGNOSIS — I110 Hypertensive heart disease with heart failure: Secondary | ICD-10-CM | POA: Diagnosis not present

## 2020-08-07 DIAGNOSIS — Z79899 Other long term (current) drug therapy: Secondary | ICD-10-CM | POA: Insufficient documentation

## 2020-08-07 DIAGNOSIS — E785 Hyperlipidemia, unspecified: Secondary | ICD-10-CM | POA: Diagnosis not present

## 2020-08-07 DIAGNOSIS — Z20822 Contact with and (suspected) exposure to covid-19: Secondary | ICD-10-CM | POA: Insufficient documentation

## 2020-08-07 DIAGNOSIS — R252 Cramp and spasm: Secondary | ICD-10-CM | POA: Diagnosis not present

## 2020-08-07 DIAGNOSIS — F329 Major depressive disorder, single episode, unspecified: Secondary | ICD-10-CM | POA: Diagnosis not present

## 2020-08-07 DIAGNOSIS — Z79890 Hormone replacement therapy (postmenopausal): Secondary | ICD-10-CM | POA: Diagnosis not present

## 2020-08-07 DIAGNOSIS — R0609 Other forms of dyspnea: Secondary | ICD-10-CM | POA: Diagnosis not present

## 2020-08-07 DIAGNOSIS — I2510 Atherosclerotic heart disease of native coronary artery without angina pectoris: Secondary | ICD-10-CM | POA: Diagnosis not present

## 2020-08-07 DIAGNOSIS — Z7982 Long term (current) use of aspirin: Secondary | ICD-10-CM | POA: Diagnosis not present

## 2020-08-07 DIAGNOSIS — E039 Hypothyroidism, unspecified: Secondary | ICD-10-CM | POA: Diagnosis not present

## 2020-08-07 DIAGNOSIS — Z87891 Personal history of nicotine dependence: Secondary | ICD-10-CM | POA: Diagnosis not present

## 2020-08-07 DIAGNOSIS — F419 Anxiety disorder, unspecified: Secondary | ICD-10-CM | POA: Diagnosis not present

## 2020-08-07 DIAGNOSIS — R0600 Dyspnea, unspecified: Secondary | ICD-10-CM | POA: Diagnosis present

## 2020-08-07 HISTORY — PX: LEFT HEART CATH AND CORONARY ANGIOGRAPHY: CATH118249

## 2020-08-07 LAB — BASIC METABOLIC PANEL
Anion gap: 8 (ref 5–15)
BUN: 12 mg/dL (ref 8–23)
CO2: 26 mmol/L (ref 22–32)
Calcium: 8.9 mg/dL (ref 8.9–10.3)
Chloride: 103 mmol/L (ref 98–111)
Creatinine, Ser: 1.17 mg/dL (ref 0.61–1.24)
GFR calc Af Amer: >60 mL/min (ref >60)
GFR calc non Af Amer: >60 mL/min (ref >60)
Glucose, Bld: 103 mg/dL — ABNORMAL HIGH (ref 70–99)
Potassium: 3.8 mmol/L (ref 3.5–5.1)
Sodium: 137 mmol/L (ref 135–145)

## 2020-08-07 LAB — POCT I-STAT, CHEM 8
BUN: 13 mg/dL (ref 8–23)
Calcium, Ion: 1.2 mmol/L (ref 1.15–1.40)
Chloride: 101 mmol/L (ref 98–111)
Creatinine, Ser: 0.9 mg/dL (ref 0.61–1.24)
Glucose, Bld: 102 mg/dL — ABNORMAL HIGH (ref 70–99)
HCT: 44 % (ref 39.0–52.0)
Hemoglobin: 15 g/dL (ref 13.0–17.0)
Potassium: 3.8 mmol/L (ref 3.5–5.1)
Sodium: 139 mmol/L (ref 135–145)
TCO2: 25 mmol/L (ref 22–32)

## 2020-08-07 SURGERY — LEFT HEART CATH AND CORONARY ANGIOGRAPHY
Anesthesia: LOCAL

## 2020-08-07 MED ORDER — HEPARIN (PORCINE) IN NACL 1000-0.9 UT/500ML-% IV SOLN
INTRAVENOUS | Status: DC | PRN
  Administered 2020-08-07 (×2): 500 mL

## 2020-08-07 MED ORDER — MIDAZOLAM HCL 2 MG/2ML IJ SOLN
INTRAMUSCULAR | Status: AC
  Filled 2020-08-07: qty 2

## 2020-08-07 MED ORDER — ONDANSETRON HCL 4 MG/2ML IJ SOLN: 4.0000 mg | Freq: Four times a day (QID) | INTRAMUSCULAR | Status: DC | PRN

## 2020-08-07 MED ORDER — ASPIRIN 81 MG PO CHEW: 81.0000 mg | CHEWABLE_TABLET | ORAL | Status: AC

## 2020-08-07 MED ORDER — SODIUM CHLORIDE 0.9% FLUSH: 3.0000 mL | INTRAVENOUS | Status: DC | PRN

## 2020-08-07 MED ORDER — MIDAZOLAM HCL 2 MG/2ML IJ SOLN
INTRAMUSCULAR | Status: DC | PRN
  Administered 2020-08-07: 2 mg via INTRAVENOUS

## 2020-08-07 MED ORDER — SODIUM CHLORIDE 0.9% FLUSH: 3.0000 mL | Freq: Two times a day (BID) | INTRAVENOUS | Status: DC

## 2020-08-07 MED ORDER — HYDRALAZINE HCL 20 MG/ML IJ SOLN: 10.0000 mg | INTRAMUSCULAR | Status: DC | PRN

## 2020-08-07 MED ORDER — SODIUM CHLORIDE 0.9 % IV SOLN: 250.0000 mL | INTRAVENOUS | Status: DC | PRN

## 2020-08-07 MED ORDER — LIDOCAINE HCL (PF) 1 % IJ SOLN
INTRAMUSCULAR | Status: DC | PRN
  Administered 2020-08-07: 2 mL via SUBCUTANEOUS

## 2020-08-07 MED ORDER — HEPARIN SODIUM (PORCINE) 1000 UNIT/ML IJ SOLN
INTRAMUSCULAR | Status: DC | PRN
  Administered 2020-08-07: 5000 [IU] via INTRAVENOUS

## 2020-08-07 MED ORDER — IOHEXOL 350 MG/ML SOLN
INTRAVENOUS | Status: DC | PRN
  Administered 2020-08-07: 40 mL

## 2020-08-07 MED ORDER — VERAPAMIL HCL 2.5 MG/ML IV SOLN
INTRAVENOUS | Status: DC | PRN
  Administered 2020-08-07: 10 mL via INTRA_ARTERIAL

## 2020-08-07 MED ORDER — FENTANYL CITRATE (PF) 100 MCG/2ML IJ SOLN
INTRAMUSCULAR | Status: AC
  Filled 2020-08-07: qty 2

## 2020-08-07 MED ORDER — FENTANYL CITRATE (PF) 100 MCG/2ML IJ SOLN
INTRAMUSCULAR | Status: DC | PRN
Start: 2020-08-07 — End: 2020-08-07
  Administered 2020-08-07: 25 ug via INTRAVENOUS

## 2020-08-07 MED ORDER — SODIUM CHLORIDE 0.9 % WEIGHT BASED INFUSION
3.0000 mL/kg/h | INTRAVENOUS | Status: AC
  Administered 2020-08-07: 3 mL/kg/h via INTRAVENOUS

## 2020-08-07 MED ORDER — SODIUM CHLORIDE 0.9 % WEIGHT BASED INFUSION: 1.0000 mL/kg/h | INTRAVENOUS | Status: DC

## 2020-08-07 MED ORDER — HEPARIN SODIUM (PORCINE) 1000 UNIT/ML IJ SOLN
INTRAMUSCULAR | Status: AC
  Filled 2020-08-07: qty 1

## 2020-08-07 MED ORDER — LIDOCAINE HCL (PF) 1 % IJ SOLN
INTRAMUSCULAR | Status: AC
  Filled 2020-08-07: qty 30

## 2020-08-07 MED ORDER — VERAPAMIL HCL 2.5 MG/ML IV SOLN
INTRAVENOUS | Status: AC
  Filled 2020-08-07: qty 2

## 2020-08-07 MED ORDER — HEPARIN (PORCINE) IN NACL 1000-0.9 UT/500ML-% IV SOLN
INTRAVENOUS | Status: AC
  Filled 2020-08-07: qty 1000

## 2020-08-07 MED ORDER — ACETAMINOPHEN 325 MG PO TABS: 650.0000 mg | ORAL_TABLET | ORAL | Status: DC | PRN

## 2020-08-07 SURGICAL SUPPLY — 9 items

## 2020-08-07 NOTE — Interval H&P Note (Signed)
History and Physical Interval Note:  08/07/2020 7:53 AM  Troy Turner  has presented today for surgery, with the diagnosis of positive stress test hypertension.  The various methods of treatment have been discussed with the patient and family. After consideration of risks, benefits and other options for treatment, the patient has consented to  Procedure(s): LEFT HEART CATH AND CORONARY ANGIOGRAPHY (N/A) and possible coronary intervention Cath Lab Visit (complete for each Cath Lab visit)  Clinical Evaluation Leading to the Procedure:   ACS: No.  Non-ACS:    Anginal Classification: CCS II  Anti-ischemic medical therapy: Minimal Therapy (1 class of medications)  Non-Invasive Test Results: Intermediate-risk stress test findings: cardiac mortality 1-3%/year  Prior CABG: No previous CABG  as a surgical intervention.  The patient's history has been reviewed, patient examined, no change in status, stable for surgery.  I have reviewed the patient's chart and labs.  Questions were answered to the patient's satisfaction.     Yates Decamp

## 2020-08-07 NOTE — Discharge Instructions (Signed)
Drink plenty of fluids for 48 hours and keep wrist elevated at heart level for 24 hours  Radial Site Care   This sheet gives you information about how to care for yourself after your procedure. Your health care provider may also give you more specific instructions. If you have problems or questions, contact your health care provider. What can I expect after the procedure? After the procedure, it is common to have:  Bruising and tenderness at the catheter insertion area. Follow these instructions at home: Medicines  Take over-the-counter and prescription medicines only as told by your health care provider. Insertion site care 1. Follow instructions from your health care provider about how to take care of your insertion site. Make sure you: ? Wash your hands with soap and water before you change your bandage (dressing). If soap and water are not available, use hand sanitizer. ? Remove your dressing as told by your health care provider. In 24 hours 2. Check your insertion site every day for signs of infection. Check for: ? Redness, swelling, or pain. ? Fluid or blood. ? Pus or a bad smell. ? Warmth. 3. Do not take baths, swim, or use a hot tub until your health care provider approves. 4. You may shower 24-48 hours after the procedure, or as directed by your health care provider. ? Remove the dressing and gently wash the site with plain soap and water. ? Pat the area dry with a clean towel. ? Do not rub the site. That could cause bleeding. 5. Do not apply powder or lotion to the site. Activity   1. For 24 hours after the procedure, or as directed by your health care provider: ? Do not flex or bend the affected arm. ? Do not push or pull heavy objects with the affected arm. ? Do not drive yourself home from the hospital or clinic. You may drive 24 hours after the procedure unless your health care provider tells you not to. ? Do not operate machinery or power tools. 2. Do not lift  anything that is heavier than 10 lb (4.5 kg), or the limit that you are told, until your health care provider says that it is safe.  For 4 days 3. Ask your health care provider when it is okay to: ? Return to work or school. ? Resume usual physical activities or sports. ? Resume sexual activity. General instructions  If the catheter site starts to bleed, raise your arm and put firm pressure on the site. If the bleeding does not stop, get help right away. This is a medical emergency.  If you went home on the same day as your procedure, a responsible adult should be with you for the first 24 hours after you arrive home.  Keep all follow-up visits as told by your health care provider. This is important. Contact a health care provider if:  You have a fever.  You have redness, swelling, or yellow drainage around your insertion site. Get help right away if:  You have unusual pain at the radial site.  The catheter insertion area swells very fast.  The insertion area is bleeding, and the bleeding does not stop when you hold steady pressure on the area.  Your arm or hand becomes pale, cool, tingly, or numb. These symptoms may represent a serious problem that is an emergency. Do not wait to see if the symptoms will go away. Get medical help right away. Call your local emergency services (911 in the [place] Do   not drive yourself to the hospital. Summary  After the procedure, it is common to have bruising and tenderness at the site.  Follow instructions from your health care provider about how to take care of your radial site wound. Check the wound every day for signs of infection.  Do not lift anything that is heavier than 10 lb (4.5 kg), or the limit that you are told, until your health care provider says that it is safe. This information is not intended to replace advice given to you by your health care provider. Make sure you discuss any questions you have with your health care  provider. Document Revised: 12/23/2017 Document Reviewed: 12/23/2017 Elsevier Patient Education  2020 [employer]  

## 2020-08-07 NOTE — CV Procedure (Signed)
        Yates Decamp, MD, [facility] 08/07/2020, 8:19 AM Office: [phone]

## 2020-08-16 DIAGNOSIS — I2510 Atherosclerotic heart disease of native coronary artery without angina pectoris: Secondary | ICD-10-CM | POA: Diagnosis not present

## 2020-08-27 DIAGNOSIS — H2513 Age-related nuclear cataract, bilateral: Secondary | ICD-10-CM | POA: Diagnosis not present

## 2020-08-27 DIAGNOSIS — H524 Presbyopia: Secondary | ICD-10-CM | POA: Diagnosis not present

## 2020-08-27 DIAGNOSIS — H25013 Cortical age-related cataract, bilateral: Secondary | ICD-10-CM | POA: Diagnosis not present

## 2020-09-03 ENCOUNTER — Encounter: Payer: Self-pay | Admitting: Cardiology

## 2020-09-03 ENCOUNTER — Ambulatory Visit: Payer: BC Managed Care – PPO | Admitting: Cardiology

## 2020-09-03 ENCOUNTER — Other Ambulatory Visit: Payer: Self-pay

## 2020-09-03 VITALS — BP 139/88 | HR 71 | Ht 71.0 in | Wt 226.0 lb

## 2020-09-03 DIAGNOSIS — R9439 Abnormal result of other cardiovascular function study: Secondary | ICD-10-CM | POA: Diagnosis not present

## 2020-09-03 DIAGNOSIS — Z8639 Personal history of other endocrine, nutritional and metabolic disease: Secondary | ICD-10-CM

## 2020-09-03 DIAGNOSIS — Z712 Person consulting for explanation of examination or test findings: Secondary | ICD-10-CM | POA: Diagnosis not present

## 2020-09-03 DIAGNOSIS — Z87891 Personal history of nicotine dependence: Secondary | ICD-10-CM

## 2020-09-03 DIAGNOSIS — I10 Essential (primary) hypertension: Secondary | ICD-10-CM | POA: Diagnosis not present

## 2020-09-03 DIAGNOSIS — E890 Postprocedural hypothyroidism: Secondary | ICD-10-CM

## 2020-09-03 NOTE — Progress Notes (Signed)
ID:  Troy Turner, DOB [birth year], MRN [id number]  PCP:  Merri BrunettePharr, Walter, MD  Cardiologist:  Tessa LernerSunit Danzel Marszalek, DO, [facility] (established care 07/05/2020) Former Cardiology Providers: Dr. Donnie Ahoilley  Date: 09/03/20 Last Office Visit: 07/25/2020  Chief Complaint  Patient presents with  . Follow-up    post cath.     HPI  Troy Turner is a 64 y.o. male who presents to the office with a chief complaint of " post cath follow up " Patient's past medical history and cardiovascular risk factors include: History of Graves' disease, hypothyroidism, hypertension, anxiety, depression, former smoker, former excessive alcohol use, advanced age.  Patient is referred to the office at the request of his primary care provider for evaluation of muscle weakness.  Patient was originally referred to the office for evaluation of muscle weakness usually after playing tennis he would have muscle cramps in bilateral lower extremities.  There is concern for underlying peripheral vascular disease and therefore he was recommended to undergo ankle-brachial index and lower extremity arterial duplex.  Ankle-brachial index is within normal limits and no significant stenosis noted on arterial duplex.  He does have mild plaque in the infrapopliteal region.  He is currently on statin therapy.  He is asked to follow-up with his primary for possible noncardiac causes of muscle weakness.  During subsequent visits patient noted effort related dyspnea and therefore underwent an ischemic evaluation. Patient was noted to have an abnormal nuclear stress test and underwent a left heart catheterization since the last visit. He has normal epicardial coronary arteries except mild disease noted in the diagonal distribution. Simvastatin has been discontinued and changed to Crestor. He continues to be on a baby aspirin. Since last visit he denies any chest pain or shortness of breath with effort related activities. He continues to play tennis without  any significant change in overall physical endurance. No use of sublingual nitroglycerin tablets since last visit.  Denies prior history of myocardial infarction, congestive heart failure, deep venous thrombosis, pulmonary embolism, stroke, transient ischemic attack.  FUNCTIONAL STATUS: Place tennis at least twice a week and goes out for a walk for half a mile per day with his dog  ALLERGIES: No Known Allergies  MEDICATION LIST PRIOR TO VISIT: Current Meds  Medication Sig  . amLODipine (NORVASC) 10 MG tablet Take 10 mg by mouth daily.  Marland Kitchen. aspirin 81 MG chewable tablet Chew 81 mg by mouth daily.  Marland Kitchen. levothyroxine (SYNTHROID) 150 MCG tablet Take 150 mcg by mouth daily before breakfast.   . lisinopril-hydrochlorothiazide (ZESTORETIC) 20-12.5 MG tablet Take 1 tablet by mouth daily.  Marland Kitchen. LORazepam (ATIVAN) 1 MG tablet Take 1 mg by mouth daily as needed for anxiety.   . nitroGLYCERIN (NITROSTAT) 0.4 MG SL tablet Place 1 tablet (0.4 mg total) under the tongue every 5 (five) minutes as needed for chest pain.  . rosuvastatin (CRESTOR) 20 MG tablet Take 20 mg by mouth daily.  . sertraline (ZOLOFT) 100 MG tablet Take 100 mg by mouth at bedtime.   . traZODone (DESYREL) 100 MG tablet Take 100 mg by mouth at bedtime.     PAST MEDICAL HISTORY: Past Medical History:  Diagnosis Date  . Anxiety   . Depression   . Erectile dysfunction   . History of Graves' disease   . History of kidney stones   . Hyperlipidemia   . Hypertension   . Hypothyroidism    history of graves disease  . Hypothyroidism   . Peyronie's disease  PAST SURGICAL HISTORY: Past Surgical History:  Procedure Laterality Date  . APPENDECTOMY    . EXTRACORPOREAL SHOCK WAVE LITHOTRIPSY Right 06/21/2018   Procedure: RIGHT EXTRACORPOREAL SHOCK WAVE LITHOTRIPSY (ESWL);  Surgeon: Sebastian Ache, MD;  Location: [facility];  Service: Urology;  Laterality: Right;  . HEMORRHOID SURGERY    . LEFT HEART CATH AND CORONARY ANGIOGRAPHY N/A  08/07/2020   Procedure: LEFT HEART CATH AND CORONARY ANGIOGRAPHY;  Surgeon: Yates Decamp, MD;  [facility]: [facility];  Service: Cardiovascular;  Laterality: N/A;  . SHOULDER SURGERY    . undesended testicle      FAMILY HISTORY: The patient family history includes Dementia in his father; Heart failure in his brother; Hydrocephalus in his brother; Parkinson's disease in his mother; Sleep apnea in his brother.  SOCIAL HISTORY:  The patient  reports that he quit smoking about 17 years ago. He has never used smokeless tobacco. He reports previous alcohol use. He reports that he does not use drugs.  REVIEW OF SYSTEMS: Review of Systems  Constitutional: Negative for chills and fever.  HENT: Negative for hoarse voice and nosebleeds.   Eyes: Negative for discharge, double vision and pain.  Cardiovascular: Negative for chest pain, claudication, dyspnea on exertion, leg swelling, near-syncope, orthopnea, palpitations, paroxysmal nocturnal dyspnea and syncope.  Respiratory: Negative for hemoptysis and shortness of breath.   Musculoskeletal: Positive for muscle cramps (better. ) and muscle weakness (Better).  Gastrointestinal: Negative for abdominal pain, constipation, diarrhea, hematemesis, hematochezia, melena, nausea and vomiting.  Neurological: Negative for dizziness and light-headedness.    PHYSICAL EXAM: Vitals with BMI 09/03/2020 08/07/2020 08/07/2020  Height 5\' 11"  - -  Weight 226 lbs - -  BMI 31.53 - -  Systolic 139 130  Diastolic 88 64 80  Pulse 71 70 59   CONSTITUTIONAL: Well-developed and well-nourished. No acute distress.  SKIN: Skin is warm and dry. No rash noted. No cyanosis. No pallor. No jaundice HEAD: Normocephalic and atraumatic.  EYES: No scleral icterus MOUTH/THROAT: Moist oral membranes.  NECK: No JVD present. No thyromegaly noted. No carotid bruits  LYMPHATIC: No visible cervical adenopathy.  CHEST Normal respiratory effort. No intercostal retractions  LUNGS:  Clear to auscultation bilaterally.  No stridor. No wheezes. No rales.  CARDIOVASCULAR: Regular rate and rhythm, positive S1-S2, no murmurs rubs or gallops appreciated. ABDOMINAL: No apparent ascites.  EXTREMITIES: No peripheral edema.  2+ femoral pulses, bilateral popliteal pulses not well appreciated, 2+ bilateral dorsalis pedis and posterior tibial pulses. HEMATOLOGIC: No significant bruising NEUROLOGIC: Oriented to person, place, and time. Nonfocal. Normal muscle tone.  PSYCHIATRIC: Normal mood and affect. Normal behavior. Cooperative  CARDIAC DATABASE: EKG: 07/05/2020:Sinus  Rhythm, 77bpm, normal axis, consider old inferior infarct, no underlying ischemia or injury pattern.  Echocardiogram: 07/19/2020:  Left ventricle cavity is normal in size. Moderate concentric hypertrophy of the left ventricle. Normal global wall motion. Normal LV systolic function with EF 55%. Doppler evidence of grade I (impaired) diastolic dysfunction, normal LAP.  Aneurysmal interatrial septum without 2D or color Doppler evidence of interatrial shunt.  No signifiant valvular abnormality.   Stress Testing: Lexiscan/modified Bruce Tetrofosmin stress test 07/17/2020: Lexiscan/modified Bruce nuclear stress test performed using 1-day protocol. No chest pain reported. Exertional dyspnea, "arm and leg heaviness" reported. Stress EKG is non-diagnostic, as this is pharmacological stress test. In addition, stress EKG at 86% MPHR showed sinus tachycardia, possible old inferior infarct, no ischemic changes. SPECT images show small size, severe intensity, partially reversible perfusion defect in apical anteroseptal myocardium. Stress  LVEF 78%. Intermediate risk study.  Heart Catheterization: Left Heart Catheterization 08/07/20:  Except for a mild 40% D1 stenosis in minor 10 to 15% luminal irregularity in the proximal and mid LAD, normal left dominant coronary arteries.  Normal LV systolic function. Suspect abnormal EKG and  abnormal stress nuclear study was related to hypertension with hypertensive heart disease as the coronary vessels are tortuous.  Suspect mild chronic diastolic heart failure.  Continue primary prevention is indicated.  40 mL contrast utilized.  Lower Extremity Arterial Duplex 07/19/2020:  No hemodynamically significant stenoses are identified in the right lower extremity arterial system. No hemodynamically significant stenoses are identified in the left lower extremity arterial system.  This exam reveals normal perfusion of the right and left lower extremity (ABI 1.00). Dampened flow in the left anterior tibial artery with mild plaque noted.  Evaluate patient for non-vascular pseud-oclaudication.  LABORATORY DATA: CBC Latest Ref Rng & Units 08/07/2020 07/25/2020  WBC 3.4 - 10.8 x10E3/uL - 8.7  Hemoglobin 13.0 - 17.0 g/dL 35.0 09.3  Hematocrit 39 - 52 % 44.0 45.0  Platelets 150 - 450 x10E3/uL - 244    CMP Latest Ref Rng & Units 08/07/2020 08/07/2020 06/18/2017  Glucose 70 - 99 mg/dL 818(E) 993(Z) -  BUN 8 - 23 mg/dL 13 12 -  Creatinine 1.69 - 1.24 mg/dL 6.78 9.38 1.01  Sodium 135 - 145 mmol/L 139 137 -  Potassium 3.5 - 5.1 mmol/L 3.8 3.8 -  Chloride 98 - 111 mmol/L 101 103 -  CO2 22 - 32 mmol/L - 26 -  Calcium 8.9 - 10.3 mg/dL - 8.9 -    Lipid Panel  No results found for: CHOL, TRIG, HDL, CHOLHDL, VLDL, LDLCALC, LDLDIRECT, LABVLDL  No components found for: NTPROBNP No results for input(s): PROBNP in the last 8760 hours. No results for input(s): TSH in the last 8760 hours.  BMP Recent Labs    08/07/20 0720 08/07/20 0805  NA 137 139  K 3.8 3.8  CL 103 101  CO2 26  --   GLUCOSE 103* 102*  BUN 12 13  CREATININE 1.17 0.90  CALCIUM 8.9  --   GFRNONAA >60  --   GFRAA >60  --     HEMOGLOBIN A1C No results found for: HGBA1C, MPG  External Labs: Collected: 06/14/2020 Total creatinine kinase 226  June 06, 2020: TSH 0.78  Lipid profile: Collected: 09/05/2019 Total cholesterol  184, triglycerides 95, HDL 54, LDL 111, non-HDL 130.  Serum creatinine 1.1 g/dL   IMPRESSION:    [id number]   1. Abnormal stress test  R94.39   2. Encounter to discuss test results  Z71.2   3. Former smoker  Z87.891   4. Benign hypertension  I10   5. 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 male whose past medical history and cardiac risk factors include: History of Graves' disease, hypothyroidism, hypertension, anxiety, depression, former smoker, former excessive alcohol use, advanced age.  Abnormal nuclear stress test:  Reviewed the results of the left heart catheterization along with the images with the patient at today's visit. Patient has mild disease in the diagonal distribution.   Simvastatin changed to Crestor by his PCP, agree with recommendations.   Continue aspirin.   No use of sublingual nitroglycerin tablets since last visit.   Educated on the importance of improving his modifiable cardiovascular risk factors.   Bilateral leg cramps:  Ankle-brachial index within normal limits with minimal plaque noted in LE.   Patient is currently on statin and ASA therapy.    Educated on continuing to walk or exercise on a daily basis as he is already doing so.    Encouraged importance of improving his modifiable cardiovascular risk factors.    Will defer further work-up to his PCP in regards to none cardiac causes of bilateral leg cramps.  Benign essential hypertension: Currently managed per primary team.  Medications reconciled.  Patient has a pending ultrasound of the abdomen to evaluate for inflow disease. If no significant disease is noted I will see the patient in 1 year follow-up or sooner if needed.  FINAL MEDICATION LIST END OF ENCOUNTER: No orders of the defined types were placed in this encounter.    Current Outpatient Medications:  .  amLODipine (NORVASC) 10 MG tablet, Take 10  mg by mouth daily., Disp: , Rfl:  .  aspirin 81 MG chewable tablet, Chew 81 mg by mouth daily., Disp: , Rfl:  .  levothyroxine (SYNTHROID) 150 MCG tablet, Take 150 mcg by mouth daily before breakfast. , Disp: , Rfl:  .  lisinopril-hydrochlorothiazide (ZESTORETIC) 20-12.5 MG tablet, Take 1 tablet by mouth daily., Disp: , Rfl:  .  LORazepam (ATIVAN) 1 MG tablet, Take 1 mg by mouth daily as needed for anxiety. , Disp: , Rfl:  .  nitroGLYCERIN (NITROSTAT) 0.4 MG SL tablet, Place 1 tablet (0.4 mg total) under the tongue every 5 (five) minutes as needed for chest pain., Disp: 30 tablet, Rfl: 0 .  rosuvastatin (CRESTOR) 20 MG tablet, Take 20 mg by mouth daily., Disp: , Rfl:  .  sertraline (ZOLOFT) 100 MG tablet, Take 100 mg by mouth at bedtime. , Disp: , Rfl:  .  traZODone (DESYREL) 100 MG tablet, Take 100 mg by mouth at bedtime., Disp: , Rfl:   No orders of the defined types were placed in this encounter.  There are no Patient Instructions on file for this visit.   --Continue cardiac medications as reconciled in final medication list. --Return in about 1 year (around 09/03/2021) for Follow up mild plaque within coronary and peripheral vascular territory. . Or sooner if needed. --Continue follow-up with your primary care physician regarding the management of your other chronic comorbid conditions.  Patient's questions and concerns were addressed to his satisfaction. He voices understanding of the instructions provided during this encounter.   This note was created using a voice recognition software as a result there may be grammatical errors inadvertently enclosed that do not reflect the nature of this encounter. Every attempt is made to correct such errors.   Tessa Lerner, [place], [facility]  Pager: [phone] Office: [phone]

## 2020-09-04 ENCOUNTER — Other Ambulatory Visit: Payer: Self-pay

## 2020-09-04 DIAGNOSIS — R9439 Abnormal result of other cardiovascular function study: Secondary | ICD-10-CM

## 2020-09-04 DIAGNOSIS — R0609 Other forms of dyspnea: Secondary | ICD-10-CM

## 2020-09-04 DIAGNOSIS — R0600 Dyspnea, unspecified: Secondary | ICD-10-CM

## 2020-09-11 ENCOUNTER — Other Ambulatory Visit: Payer: Self-pay

## 2020-09-11 DIAGNOSIS — I739 Peripheral vascular disease, unspecified: Secondary | ICD-10-CM

## 2020-09-11 DIAGNOSIS — M6281 Muscle weakness (generalized): Secondary | ICD-10-CM

## 2020-09-11 DIAGNOSIS — R252 Cramp and spasm: Secondary | ICD-10-CM

## 2020-09-13 DIAGNOSIS — Z0000 Encounter for general adult medical examination without abnormal findings: Secondary | ICD-10-CM | POA: Diagnosis not present

## 2020-09-13 DIAGNOSIS — E039 Hypothyroidism, unspecified: Secondary | ICD-10-CM | POA: Diagnosis not present

## 2020-09-13 DIAGNOSIS — Z125 Encounter for screening for malignant neoplasm of prostate: Secondary | ICD-10-CM | POA: Diagnosis not present

## 2020-09-13 DIAGNOSIS — I129 Hypertensive chronic kidney disease with stage 1 through stage 4 chronic kidney disease, or unspecified chronic kidney disease: Secondary | ICD-10-CM | POA: Diagnosis not present

## 2020-09-20 DIAGNOSIS — F1021 Alcohol dependence, in remission: Secondary | ICD-10-CM | POA: Diagnosis not present

## 2020-09-20 DIAGNOSIS — R972 Elevated prostate specific antigen [PSA]: Secondary | ICD-10-CM | POA: Diagnosis not present

## 2020-09-20 DIAGNOSIS — Z0001 Encounter for general adult medical examination with abnormal findings: Secondary | ICD-10-CM | POA: Diagnosis not present

## 2020-09-20 DIAGNOSIS — I129 Hypertensive chronic kidney disease with stage 1 through stage 4 chronic kidney disease, or unspecified chronic kidney disease: Secondary | ICD-10-CM | POA: Diagnosis not present

## 2020-09-20 DIAGNOSIS — F339 Major depressive disorder, recurrent, unspecified: Secondary | ICD-10-CM | POA: Diagnosis not present

## 2020-09-20 DIAGNOSIS — E890 Postprocedural hypothyroidism: Secondary | ICD-10-CM | POA: Diagnosis not present

## 2020-10-01 DIAGNOSIS — Z1212 Encounter for screening for malignant neoplasm of rectum: Secondary | ICD-10-CM | POA: Diagnosis not present

## 2020-10-01 DIAGNOSIS — Z1211 Encounter for screening for malignant neoplasm of colon: Secondary | ICD-10-CM | POA: Diagnosis not present

## 2020-11-19 DIAGNOSIS — E890 Postprocedural hypothyroidism: Secondary | ICD-10-CM | POA: Diagnosis not present

## 2021-03-21 DIAGNOSIS — H2511 Age-related nuclear cataract, right eye: Secondary | ICD-10-CM | POA: Diagnosis not present

## 2021-03-21 DIAGNOSIS — H2513 Age-related nuclear cataract, bilateral: Secondary | ICD-10-CM | POA: Diagnosis not present

## 2021-03-21 DIAGNOSIS — H25013 Cortical age-related cataract, bilateral: Secondary | ICD-10-CM | POA: Diagnosis not present

## 2021-03-21 DIAGNOSIS — H35033 Hypertensive retinopathy, bilateral: Secondary | ICD-10-CM | POA: Diagnosis not present

## 2021-04-17 DIAGNOSIS — H25811 Combined forms of age-related cataract, right eye: Secondary | ICD-10-CM | POA: Diagnosis not present

## 2021-04-17 DIAGNOSIS — H2511 Age-related nuclear cataract, right eye: Secondary | ICD-10-CM | POA: Diagnosis not present

## 2021-04-30 DIAGNOSIS — R102 Pelvic and perineal pain: Secondary | ICD-10-CM | POA: Diagnosis not present

## 2021-06-24 DIAGNOSIS — S76311A Strain of muscle, fascia and tendon of the posterior muscle group at thigh level, right thigh, initial encounter: Secondary | ICD-10-CM | POA: Diagnosis not present

## 2021-08-27 DIAGNOSIS — H25012 Cortical age-related cataract, left eye: Secondary | ICD-10-CM | POA: Diagnosis not present

## 2021-08-27 DIAGNOSIS — H35361 Drusen (degenerative) of macula, right eye: Secondary | ICD-10-CM | POA: Diagnosis not present

## 2021-08-27 DIAGNOSIS — H35033 Hypertensive retinopathy, bilateral: Secondary | ICD-10-CM | POA: Diagnosis not present

## 2021-08-27 DIAGNOSIS — H2512 Age-related nuclear cataract, left eye: Secondary | ICD-10-CM | POA: Diagnosis not present

## 2021-08-27 DIAGNOSIS — H532 Diplopia: Secondary | ICD-10-CM | POA: Diagnosis not present

## 2021-09-03 ENCOUNTER — Ambulatory Visit: Payer: BC Managed Care – PPO | Admitting: Cardiology

## 2021-09-17 DIAGNOSIS — E039 Hypothyroidism, unspecified: Secondary | ICD-10-CM | POA: Diagnosis not present

## 2021-09-17 DIAGNOSIS — Z125 Encounter for screening for malignant neoplasm of prostate: Secondary | ICD-10-CM | POA: Diagnosis not present

## 2021-09-17 DIAGNOSIS — Z0000 Encounter for general adult medical examination without abnormal findings: Secondary | ICD-10-CM | POA: Diagnosis not present

## 2021-09-24 DIAGNOSIS — R972 Elevated prostate specific antigen [PSA]: Secondary | ICD-10-CM | POA: Diagnosis not present

## 2021-09-24 DIAGNOSIS — E890 Postprocedural hypothyroidism: Secondary | ICD-10-CM | POA: Diagnosis not present

## 2021-09-24 DIAGNOSIS — Z0001 Encounter for general adult medical examination with abnormal findings: Secondary | ICD-10-CM | POA: Diagnosis not present

## 2021-09-24 DIAGNOSIS — I129 Hypertensive chronic kidney disease with stage 1 through stage 4 chronic kidney disease, or unspecified chronic kidney disease: Secondary | ICD-10-CM | POA: Diagnosis not present

## 2021-09-24 DIAGNOSIS — I2510 Atherosclerotic heart disease of native coronary artery without angina pectoris: Secondary | ICD-10-CM | POA: Diagnosis not present

## 2021-09-24 DIAGNOSIS — Z23 Encounter for immunization: Secondary | ICD-10-CM | POA: Diagnosis not present

## 2021-11-13 DIAGNOSIS — E890 Postprocedural hypothyroidism: Secondary | ICD-10-CM | POA: Diagnosis not present

## 2021-12-25 DIAGNOSIS — N401 Enlarged prostate with lower urinary tract symptoms: Secondary | ICD-10-CM | POA: Diagnosis not present

## 2021-12-25 DIAGNOSIS — N200 Calculus of kidney: Secondary | ICD-10-CM | POA: Diagnosis not present

## 2021-12-25 DIAGNOSIS — R972 Elevated prostate specific antigen [PSA]: Secondary | ICD-10-CM | POA: Diagnosis not present

## 2021-12-25 DIAGNOSIS — R3911 Hesitancy of micturition: Secondary | ICD-10-CM | POA: Diagnosis not present

## 2022-01-29 DIAGNOSIS — E039 Hypothyroidism, unspecified: Secondary | ICD-10-CM | POA: Diagnosis not present

## 2022-02-13 DIAGNOSIS — M9902 Segmental and somatic dysfunction of thoracic region: Secondary | ICD-10-CM | POA: Diagnosis not present

## 2022-02-13 DIAGNOSIS — M25619 Stiffness of unspecified shoulder, not elsewhere classified: Secondary | ICD-10-CM | POA: Diagnosis not present

## 2022-02-13 DIAGNOSIS — M7918 Myalgia, other site: Secondary | ICD-10-CM | POA: Diagnosis not present

## 2022-02-13 DIAGNOSIS — M9901 Segmental and somatic dysfunction of cervical region: Secondary | ICD-10-CM | POA: Diagnosis not present

## 2022-02-13 DIAGNOSIS — M9904 Segmental and somatic dysfunction of sacral region: Secondary | ICD-10-CM | POA: Diagnosis not present

## 2022-02-13 DIAGNOSIS — M9903 Segmental and somatic dysfunction of lumbar region: Secondary | ICD-10-CM | POA: Diagnosis not present

## 2022-02-18 DIAGNOSIS — M9901 Segmental and somatic dysfunction of cervical region: Secondary | ICD-10-CM | POA: Diagnosis not present

## 2022-02-18 DIAGNOSIS — M9903 Segmental and somatic dysfunction of lumbar region: Secondary | ICD-10-CM | POA: Diagnosis not present

## 2022-02-18 DIAGNOSIS — M9904 Segmental and somatic dysfunction of sacral region: Secondary | ICD-10-CM | POA: Diagnosis not present

## 2022-02-18 DIAGNOSIS — M9902 Segmental and somatic dysfunction of thoracic region: Secondary | ICD-10-CM | POA: Diagnosis not present

## 2022-02-24 DIAGNOSIS — F32A Depression, unspecified: Secondary | ICD-10-CM | POA: Diagnosis not present

## 2022-02-24 DIAGNOSIS — M791 Myalgia, unspecified site: Secondary | ICD-10-CM | POA: Diagnosis not present

## 2022-02-24 DIAGNOSIS — E039 Hypothyroidism, unspecified: Secondary | ICD-10-CM | POA: Diagnosis not present

## 2022-02-27 DIAGNOSIS — M9904 Segmental and somatic dysfunction of sacral region: Secondary | ICD-10-CM | POA: Diagnosis not present

## 2022-02-27 DIAGNOSIS — M9901 Segmental and somatic dysfunction of cervical region: Secondary | ICD-10-CM | POA: Diagnosis not present

## 2022-02-27 DIAGNOSIS — M9903 Segmental and somatic dysfunction of lumbar region: Secondary | ICD-10-CM | POA: Diagnosis not present

## 2022-02-27 DIAGNOSIS — M25619 Stiffness of unspecified shoulder, not elsewhere classified: Secondary | ICD-10-CM | POA: Diagnosis not present

## 2022-02-27 DIAGNOSIS — M9902 Segmental and somatic dysfunction of thoracic region: Secondary | ICD-10-CM | POA: Diagnosis not present

## 2022-02-27 DIAGNOSIS — M7918 Myalgia, other site: Secondary | ICD-10-CM | POA: Diagnosis not present

## 2022-03-06 DIAGNOSIS — E039 Hypothyroidism, unspecified: Secondary | ICD-10-CM | POA: Diagnosis not present

## 2022-03-06 DIAGNOSIS — I2510 Atherosclerotic heart disease of native coronary artery without angina pectoris: Secondary | ICD-10-CM | POA: Diagnosis not present

## 2022-03-06 DIAGNOSIS — F32A Depression, unspecified: Secondary | ICD-10-CM | POA: Diagnosis not present

## 2022-03-06 DIAGNOSIS — M609 Myositis, unspecified: Secondary | ICD-10-CM | POA: Diagnosis not present

## 2022-03-13 DIAGNOSIS — M25619 Stiffness of unspecified shoulder, not elsewhere classified: Secondary | ICD-10-CM | POA: Diagnosis not present

## 2022-03-13 DIAGNOSIS — M9903 Segmental and somatic dysfunction of lumbar region: Secondary | ICD-10-CM | POA: Diagnosis not present

## 2022-03-13 DIAGNOSIS — M7918 Myalgia, other site: Secondary | ICD-10-CM | POA: Diagnosis not present

## 2022-03-13 DIAGNOSIS — M9902 Segmental and somatic dysfunction of thoracic region: Secondary | ICD-10-CM | POA: Diagnosis not present

## 2022-03-13 DIAGNOSIS — M9904 Segmental and somatic dysfunction of sacral region: Secondary | ICD-10-CM | POA: Diagnosis not present

## 2022-03-13 DIAGNOSIS — M9901 Segmental and somatic dysfunction of cervical region: Secondary | ICD-10-CM | POA: Diagnosis not present

## 2022-03-17 DIAGNOSIS — T889XXA Complication of surgical and medical care, unspecified, initial encounter: Secondary | ICD-10-CM | POA: Diagnosis not present

## 2022-03-17 DIAGNOSIS — R748 Abnormal levels of other serum enzymes: Secondary | ICD-10-CM | POA: Diagnosis not present

## 2022-03-17 DIAGNOSIS — M791 Myalgia, unspecified site: Secondary | ICD-10-CM | POA: Diagnosis not present

## 2022-03-17 DIAGNOSIS — M542 Cervicalgia: Secondary | ICD-10-CM | POA: Diagnosis not present

## 2022-04-01 ENCOUNTER — Ambulatory Visit: Payer: BC Managed Care – PPO | Admitting: Cardiology

## 2022-04-03 DIAGNOSIS — M9902 Segmental and somatic dysfunction of thoracic region: Secondary | ICD-10-CM | POA: Diagnosis not present

## 2022-04-03 DIAGNOSIS — M25619 Stiffness of unspecified shoulder, not elsewhere classified: Secondary | ICD-10-CM | POA: Diagnosis not present

## 2022-04-03 DIAGNOSIS — M9901 Segmental and somatic dysfunction of cervical region: Secondary | ICD-10-CM | POA: Diagnosis not present

## 2022-04-03 DIAGNOSIS — M9904 Segmental and somatic dysfunction of sacral region: Secondary | ICD-10-CM | POA: Diagnosis not present

## 2022-04-03 DIAGNOSIS — M7918 Myalgia, other site: Secondary | ICD-10-CM | POA: Diagnosis not present

## 2022-04-03 DIAGNOSIS — M9903 Segmental and somatic dysfunction of lumbar region: Secondary | ICD-10-CM | POA: Diagnosis not present

## 2022-04-15 ENCOUNTER — Encounter: Payer: Self-pay | Admitting: Cardiology

## 2022-04-15 ENCOUNTER — Ambulatory Visit: Payer: BC Managed Care – PPO | Admitting: Cardiology

## 2022-04-15 VITALS — BP 128/74 | HR 87 | Temp 98.2°F | Resp 16 | Ht 71.0 in | Wt 242.0 lb

## 2022-04-15 DIAGNOSIS — I10 Essential (primary) hypertension: Secondary | ICD-10-CM | POA: Diagnosis not present

## 2022-04-15 DIAGNOSIS — I2510 Atherosclerotic heart disease of native coronary artery without angina pectoris: Secondary | ICD-10-CM

## 2022-04-15 DIAGNOSIS — Z8639 Personal history of other endocrine, nutritional and metabolic disease: Secondary | ICD-10-CM

## 2022-04-15 DIAGNOSIS — Z87891 Personal history of nicotine dependence: Secondary | ICD-10-CM

## 2022-04-15 MED ORDER — ROSUVASTATIN CALCIUM 5 MG PO TABS: 5.0000 mg | ORAL_TABLET | Freq: Every day | ORAL | 0 refills | Status: DC

## 2022-04-15 NOTE — Progress Notes (Signed)
? ?ID:  Troy Turner, DOB 09/11/1956, MRN [id number] ? ?PCP:  Merri Brunette, MD  ?Cardiologist:  Tessa Lerner, DO, [facility] (established care 07/05/2020) ?Former Cardiology Providers: Dr. Donnie Aho ? ?Date: 04/15/22 ?Last Office Visit: 09/03/2020 ? ?Chief Complaint  ?Patient presents with  ? Follow-up  ?  1.5 year follow up -nonobstructive CAD  ? ? ?HPI  ?Troy Turner is a 66 y.o. male whose past medical history and cardiovascular risk factors include: History of Graves' disease, hypothyroidism, hypertension, anxiety, depression, former smoker, former excessive alcohol use, advanced age. ? ?He is here for 1.5-year follow-up for mild nonobstructive CAD.  He was recently referred to the practice for muscle weakness and underwent peripheral work-up which was essentially unremarkable with the exception of mild plaque in the infrapopliteal region.  He was started on statin therapy.  During his subsequent visits patient had effort related dyspnea and underwent ischemic work-up which led to left heart catheterization.  He was noted to have disease in the diagonal/LAD distribution.  His simvastatin was transitioned to Crestor 20 mg p.o. daily.   ? ?Over the last several weeks patient was experiencing muscle aches again and was requested to stop statin therapy.  As soon as he stopped Crestor patient states that his myalgias resolved.  Patient is requesting guidance with regards to statin therapy given his PAD and CAD work-up in the past. ? ?He continues to play tennis regularly and has not noticed any change in overall functional status. ? ?FUNCTIONAL STATUS: ?Place tennis at least twice a week and goes out for a walk for half a mile per day with his dog ? ?ALLERGIES: ?No Known Allergies ? ?MEDICATION LIST PRIOR TO VISIT: ?Current Meds  ?Medication Sig  ? amLODipine (NORVASC) 10 MG tablet Take 10 mg by mouth daily.  ? aspirin 81 MG chewable tablet Chew 81 mg by mouth daily.  ? levothyroxine (SYNTHROID) 150 MCG tablet Take  150 mcg by mouth daily before breakfast.   ? lisinopril-hydrochlorothiazide (ZESTORETIC) 20-12.5 MG tablet Take 1 tablet by mouth daily.  ? LORazepam (ATIVAN) 1 MG tablet Take 1 mg by mouth daily as needed for anxiety.   ? nitroGLYCERIN (NITROSTAT) 0.4 MG SL tablet Place 1 tablet (0.4 mg total) under the tongue every 5 (five) minutes as needed for chest pain.  ? rosuvastatin (CRESTOR) 5 MG tablet Take 1 tablet (5 mg total) by mouth at bedtime.  ? sertraline (ZOLOFT) 100 MG tablet Take 100 mg by mouth at bedtime.   ? traZODone (DESYREL) 100 MG tablet Take 100 mg by mouth at bedtime.  ?  ? ?PAST MEDICAL HISTORY: ?Past Medical History:  ?Diagnosis Date  ? Anxiety   ? Depression   ? Erectile dysfunction   ? History of Graves' disease   ? History of kidney stones   ? Hyperlipidemia   ? Hypertension   ? Hypothyroidism   ? history of graves disease  ? Hypothyroidism   ? Peyronie's disease   ? ? ?PAST SURGICAL HISTORY: ?Past Surgical History:  ?Procedure Laterality Date  ? APPENDECTOMY    ? EXTRACORPOREAL SHOCK WAVE LITHOTRIPSY Right 06/21/2018  ? Procedure: RIGHT EXTRACORPOREAL SHOCK WAVE LITHOTRIPSY (ESWL);  Surgeon: Sebastian Ache, MD;  Location: [facility];  Service: Urology;  Laterality: Right;  ? HEMORRHOID SURGERY    ? LEFT HEART CATH AND CORONARY ANGIOGRAPHY N/A 08/07/2020  ? Procedure: LEFT HEART CATH AND CORONARY ANGIOGRAPHY;  Surgeon: Yates Decamp, MD;  Location: [facility];  Service: Cardiovascular;  Laterality: N/A;  ? SHOULDER SURGERY    ? undesended testicle    ? ? ?FAMILY HISTORY: ?The patient family history includes Dementia in his father; Heart failure in his brother; Hydrocephalus in his brother; Parkinson's disease in his mother; Sleep apnea in his brother. ? ?SOCIAL HISTORY:  ?The patient  reports that he quit smoking about 18 years ago. His smoking use included cigarettes. He has never used smokeless tobacco. He reports that he does not currently use alcohol. He reports that he does not use  drugs. ? ?REVIEW OF SYSTEMS: ?Review of Systems  ?Constitutional: Negative for chills and fever.  ?HENT:  Negative for hoarse voice and nosebleeds.   ?Eyes:  Negative for discharge, double vision and pain.  ?Cardiovascular:  Negative for chest pain, claudication, dyspnea on exertion, leg swelling, near-syncope, orthopnea, palpitations, paroxysmal nocturnal dyspnea and syncope.  ?Respiratory:  Negative for hemoptysis and shortness of breath.   ?Musculoskeletal:  Positive for muscle cramps (Chronic and stable) and muscle weakness (Chronic and stable).  ?Gastrointestinal:  Negative for abdominal pain, constipation, diarrhea, hematemesis, hematochezia, melena, nausea and vomiting.  ?Neurological:  Negative for dizziness and light-headedness.  ? ?PHYSICAL EXAM: ? ?  04/15/2022  ?  1:53 PM 09/03/2020  ? 10:36 AM 08/07/2020  ? 11:00 AM  ?Vitals with BMI  ?Height 5\' 11"  5\' 11"    ?Weight 242 lbs 226 lbs   ?BMI 33.77 31.53   ?Systolic 128 139 161130  ?Diastolic 74 88 64  ?Pulse 87 71 70  ? ?CONSTITUTIONAL: Well-developed and well-nourished. No acute distress.  ?SKIN: Skin is warm and dry. No rash noted. No cyanosis. No pallor. No jaundice ?HEAD: Normocephalic and atraumatic.  ?EYES: No scleral icterus ?MOUTH/THROAT: Moist oral membranes.  ?NECK: No JVD present. No thyromegaly noted. No carotid bruits  ?LYMPHATIC: No visible cervical adenopathy.  ?CHEST Normal respiratory effort. No intercostal retractions  ?LUNGS: Clear to auscultation bilaterally.  No stridor. No wheezes. No rales.  ?CARDIOVASCULAR: Regular rate and rhythm, positive S1-S2, no murmurs rubs or gallops appreciated. ?ABDOMINAL: No apparent ascites.  ?EXTREMITIES: No peripheral edema.  2+ femoral pulses, bilateral popliteal pulses not well appreciated, 2+ bilateral dorsalis pedis and posterior tibial pulses. ?HEMATOLOGIC: No significant bruising ?NEUROLOGIC: Oriented to person, place, and time. Nonfocal. Normal muscle tone.  ?PSYCHIATRIC: Normal mood and affect. Normal  behavior. Cooperative ? ?CARDIAC DATABASE: ?EKG: ?04/15/2022: Normal sinus rhythm, 83 bpm, normal axis, without underlying ischemia injury pattern. ? ?Echocardiogram: ?07/19/2020:  ?Left ventricle cavity is normal in size. Moderate concentric hypertrophy of the left ventricle. Normal global wall motion. Normal LV systolic function with EF 55%. Doppler evidence of grade I (impaired) diastolic dysfunction, normal LAP.  ?Aneurysmal interatrial septum without 2D or color Doppler evidence of interatrial shunt.  ?No signifiant valvular abnormality.  ? ?Stress Testing: ?Lexiscan/modified Bruce Tetrofosmin stress test 07/17/2020: ?Lexiscan/modified Bruce nuclear stress test performed using 1-day protocol. No chest pain reported. Exertional dyspnea, "arm and leg heaviness" reported. Stress EKG is non-diagnostic, as this is pharmacological stress test. In addition, stress EKG at 86% MPHR showed sinus tachycardia, possible old inferior infarct, no ischemic changes. ?SPECT images show small size, severe intensity, partially reversible perfusion defect in apical anteroseptal myocardium. Stress LVEF 78%. ?Intermediate risk study. ? ?Heart Catheterization: ?Left Heart Catheterization 08/07/20:  ?Except for a mild 40% D1 stenosis in minor 10 to 15% luminal irregularity in the proximal and mid LAD, normal left dominant coronary arteries.  Normal LV systolic function. ?Suspect abnormal EKG and abnormal stress nuclear study was related  to hypertension with hypertensive heart disease as the coronary vessels are tortuous.  Suspect mild chronic diastolic heart failure.  Continue primary prevention is indicated.  40 mL contrast utilized. ? ?Lower Extremity Arterial Duplex 07/19/2020:  ?No hemodynamically significant stenoses are identified in the right lower extremity arterial system. No hemodynamically significant stenoses are identified in the left lower extremity arterial system.  ?This exam reveals normal perfusion of the right and left  lower extremity (ABI 1.00).  Dampened flow in the left anterior tibial artery with mild plaque noted.  ?Evaluate patient for non-vascular pseud-oclaudication. ? ?LABORATORY DATA: ?External Labs: ?Collected: 7/15/

## 2022-04-16 DIAGNOSIS — M7918 Myalgia, other site: Secondary | ICD-10-CM | POA: Diagnosis not present

## 2022-04-16 DIAGNOSIS — M9902 Segmental and somatic dysfunction of thoracic region: Secondary | ICD-10-CM | POA: Diagnosis not present

## 2022-04-16 DIAGNOSIS — M25619 Stiffness of unspecified shoulder, not elsewhere classified: Secondary | ICD-10-CM | POA: Diagnosis not present

## 2022-04-16 DIAGNOSIS — M9903 Segmental and somatic dysfunction of lumbar region: Secondary | ICD-10-CM | POA: Diagnosis not present

## 2022-04-16 DIAGNOSIS — M9904 Segmental and somatic dysfunction of sacral region: Secondary | ICD-10-CM | POA: Diagnosis not present

## 2022-04-16 DIAGNOSIS — M9901 Segmental and somatic dysfunction of cervical region: Secondary | ICD-10-CM | POA: Diagnosis not present

## 2022-04-17 DIAGNOSIS — I2510 Atherosclerotic heart disease of native coronary artery without angina pectoris: Secondary | ICD-10-CM | POA: Diagnosis not present

## 2022-04-17 DIAGNOSIS — M47812 Spondylosis without myelopathy or radiculopathy, cervical region: Secondary | ICD-10-CM | POA: Diagnosis not present

## 2022-05-06 DIAGNOSIS — E785 Hyperlipidemia, unspecified: Secondary | ICD-10-CM | POA: Diagnosis not present

## 2022-05-06 DIAGNOSIS — E890 Postprocedural hypothyroidism: Secondary | ICD-10-CM | POA: Diagnosis not present

## 2022-05-06 DIAGNOSIS — I129 Hypertensive chronic kidney disease with stage 1 through stage 4 chronic kidney disease, or unspecified chronic kidney disease: Secondary | ICD-10-CM | POA: Diagnosis not present

## 2022-06-06 DIAGNOSIS — M25619 Stiffness of unspecified shoulder, not elsewhere classified: Secondary | ICD-10-CM | POA: Diagnosis not present

## 2022-06-06 DIAGNOSIS — M9902 Segmental and somatic dysfunction of thoracic region: Secondary | ICD-10-CM | POA: Diagnosis not present

## 2022-06-06 DIAGNOSIS — M9903 Segmental and somatic dysfunction of lumbar region: Secondary | ICD-10-CM | POA: Diagnosis not present

## 2022-06-06 DIAGNOSIS — M7918 Myalgia, other site: Secondary | ICD-10-CM | POA: Diagnosis not present

## 2022-06-06 DIAGNOSIS — M9901 Segmental and somatic dysfunction of cervical region: Secondary | ICD-10-CM | POA: Diagnosis not present

## 2022-06-06 DIAGNOSIS — M9904 Segmental and somatic dysfunction of sacral region: Secondary | ICD-10-CM | POA: Diagnosis not present

## 2022-06-10 DIAGNOSIS — M9903 Segmental and somatic dysfunction of lumbar region: Secondary | ICD-10-CM | POA: Diagnosis not present

## 2022-06-10 DIAGNOSIS — M9901 Segmental and somatic dysfunction of cervical region: Secondary | ICD-10-CM | POA: Diagnosis not present

## 2022-06-10 DIAGNOSIS — M7918 Myalgia, other site: Secondary | ICD-10-CM | POA: Diagnosis not present

## 2022-06-10 DIAGNOSIS — M9902 Segmental and somatic dysfunction of thoracic region: Secondary | ICD-10-CM | POA: Diagnosis not present

## 2022-06-10 DIAGNOSIS — M9904 Segmental and somatic dysfunction of sacral region: Secondary | ICD-10-CM | POA: Diagnosis not present

## 2022-06-10 DIAGNOSIS — M25619 Stiffness of unspecified shoulder, not elsewhere classified: Secondary | ICD-10-CM | POA: Diagnosis not present

## 2022-07-14 DIAGNOSIS — E7800 Pure hypercholesterolemia, unspecified: Secondary | ICD-10-CM | POA: Diagnosis not present

## 2022-07-14 DIAGNOSIS — E039 Hypothyroidism, unspecified: Secondary | ICD-10-CM | POA: Diagnosis not present

## 2022-07-14 DIAGNOSIS — I10 Essential (primary) hypertension: Secondary | ICD-10-CM | POA: Diagnosis not present

## 2022-07-14 DIAGNOSIS — Z0001 Encounter for general adult medical examination with abnormal findings: Secondary | ICD-10-CM | POA: Diagnosis not present

## 2022-07-14 DIAGNOSIS — R972 Elevated prostate specific antigen [PSA]: Secondary | ICD-10-CM | POA: Diagnosis not present

## 2022-07-21 ENCOUNTER — Other Ambulatory Visit: Payer: Self-pay | Admitting: Cardiology

## 2022-07-21 DIAGNOSIS — I2510 Atherosclerotic heart disease of native coronary artery without angina pectoris: Secondary | ICD-10-CM

## 2022-07-23 DIAGNOSIS — Z1283 Encounter for screening for malignant neoplasm of skin: Secondary | ICD-10-CM | POA: Diagnosis not present

## 2022-07-23 DIAGNOSIS — L820 Inflamed seborrheic keratosis: Secondary | ICD-10-CM | POA: Diagnosis not present

## 2022-07-23 DIAGNOSIS — D225 Melanocytic nevi of trunk: Secondary | ICD-10-CM | POA: Diagnosis not present

## 2022-08-25 ENCOUNTER — Other Ambulatory Visit: Payer: Self-pay | Admitting: Cardiology

## 2022-08-25 DIAGNOSIS — I2510 Atherosclerotic heart disease of native coronary artery without angina pectoris: Secondary | ICD-10-CM

## 2022-08-26 DIAGNOSIS — R972 Elevated prostate specific antigen [PSA]: Secondary | ICD-10-CM | POA: Diagnosis not present

## 2022-08-26 DIAGNOSIS — Z0000 Encounter for general adult medical examination without abnormal findings: Secondary | ICD-10-CM | POA: Diagnosis not present

## 2022-08-26 DIAGNOSIS — E890 Postprocedural hypothyroidism: Secondary | ICD-10-CM | POA: Diagnosis not present

## 2022-08-26 DIAGNOSIS — F339 Major depressive disorder, recurrent, unspecified: Secondary | ICD-10-CM | POA: Diagnosis not present

## 2022-09-03 DIAGNOSIS — M9903 Segmental and somatic dysfunction of lumbar region: Secondary | ICD-10-CM | POA: Diagnosis not present

## 2022-09-03 DIAGNOSIS — M7918 Myalgia, other site: Secondary | ICD-10-CM | POA: Diagnosis not present

## 2022-09-03 DIAGNOSIS — M9904 Segmental and somatic dysfunction of sacral region: Secondary | ICD-10-CM | POA: Diagnosis not present

## 2022-09-03 DIAGNOSIS — M9902 Segmental and somatic dysfunction of thoracic region: Secondary | ICD-10-CM | POA: Diagnosis not present

## 2022-09-03 DIAGNOSIS — M9901 Segmental and somatic dysfunction of cervical region: Secondary | ICD-10-CM | POA: Diagnosis not present

## 2022-09-03 DIAGNOSIS — M25619 Stiffness of unspecified shoulder, not elsewhere classified: Secondary | ICD-10-CM | POA: Diagnosis not present

## 2022-09-23 ENCOUNTER — Other Ambulatory Visit: Payer: Self-pay | Admitting: Cardiology

## 2022-09-23 DIAGNOSIS — I2510 Atherosclerotic heart disease of native coronary artery without angina pectoris: Secondary | ICD-10-CM

## 2023-01-08 ENCOUNTER — Other Ambulatory Visit: Payer: Self-pay

## 2023-01-08 ENCOUNTER — Other Ambulatory Visit: Payer: Self-pay | Admitting: Cardiology

## 2023-01-08 DIAGNOSIS — I2510 Atherosclerotic heart disease of native coronary artery without angina pectoris: Secondary | ICD-10-CM

## 2023-04-15 ENCOUNTER — Other Ambulatory Visit: Payer: Self-pay | Admitting: Cardiology

## 2023-04-15 DIAGNOSIS — I2510 Atherosclerotic heart disease of native coronary artery without angina pectoris: Secondary | ICD-10-CM

## 2024-03-23 ENCOUNTER — Ambulatory Visit: Payer: PRIVATE HEALTH INSURANCE | Admitting: Cardiology

## 2024-03-31 ENCOUNTER — Telehealth: Payer: Self-pay | Admitting: Pharmacy Technician

## 2024-03-31 ENCOUNTER — Ambulatory Visit: Attending: Cardiology | Admitting: Cardiology

## 2024-03-31 ENCOUNTER — Encounter: Payer: Self-pay | Admitting: Cardiology

## 2024-03-31 ENCOUNTER — Other Ambulatory Visit (HOSPITAL_COMMUNITY): Payer: Self-pay

## 2024-03-31 VITALS — BP 140/88 | HR 56 | Resp 16 | Ht 71.0 in | Wt 246.0 lb

## 2024-03-31 DIAGNOSIS — Z8639 Personal history of other endocrine, nutritional and metabolic disease: Secondary | ICD-10-CM | POA: Insufficient documentation

## 2024-03-31 DIAGNOSIS — Z789 Other specified health status: Secondary | ICD-10-CM | POA: Insufficient documentation

## 2024-03-31 DIAGNOSIS — I10 Essential (primary) hypertension: Secondary | ICD-10-CM | POA: Insufficient documentation

## 2024-03-31 DIAGNOSIS — E782 Mixed hyperlipidemia: Secondary | ICD-10-CM | POA: Insufficient documentation

## 2024-03-31 DIAGNOSIS — I2510 Atherosclerotic heart disease of native coronary artery without angina pectoris: Secondary | ICD-10-CM | POA: Insufficient documentation

## 2024-03-31 DIAGNOSIS — Z87891 Personal history of nicotine dependence: Secondary | ICD-10-CM | POA: Diagnosis present

## 2024-03-31 MED ORDER — NEXLIZET 180-10 MG PO TABS: 1.0000 | ORAL_TABLET | Freq: Every morning | ORAL | 3 refills | Status: DC

## 2024-03-31 NOTE — Patient Instructions (Addendum)
 Medication Instructions:  Your physician has recommended you make the following change in your medication:   STOP Ezetimibe (Zetia)  START Nexlizet  180-10 mg once daily in the morning   *If you need a refill on your cardiac medications before your next appointment, please call your pharmacy*  Lab Work: To be completed in 6 weeks: FASTING lipid panel and CMP (approximately 05/12/24)  If you have labs (blood work) drawn today and your tests are completely normal, you will receive your results only by: MyChart Message (if you have MyChart) OR A paper copy in the mail If you have any lab test that is abnormal or we need to change your treatment, we will call you to review the results.  Testing/Procedures: None ordered today.  Follow-Up: At [facility], you and your health needs are our priority.  As part of our continuing mission to provide you with exceptional heart care, we have created designated Provider Care Teams.  These Care Teams include your primary Cardiologist (physician) and Advanced Practice Providers (APPs -  Physician Assistants and Nurse Practitioners) who all work together to provide you with the care you need, when you need it.  We recommend signing up for the patient portal called "MyChart".  Sign up information is provided on this After Visit Summary.  MyChart is used to connect with patients for Virtual Visits (Telemedicine).  Patients are able to view lab/test results, encounter notes, upcoming appointments, etc.  Non-urgent messages can be sent to your provider as well.   To learn more about what you can do with MyChart, go to [email or web address].    Your next appointment:   1 year(s)  The format for your next appointment:   In Person  Provider:   Olinda Bertrand, [facility]

## 2024-03-31 NOTE — Telephone Encounter (Signed)
 Pharmacy Patient Advocate Encounter   Received notification from Fax that prior authorization for Nexlizet  is required/requested.   Insurance verification completed.   The patient is insured through [facility] .   Per test claim: PA required; PA submitted to above mentioned insurance via CoverMyMeds Key/confirmation #/[id number] Status is pending

## 2024-03-31 NOTE — Progress Notes (Signed)
 Cardiology Office Note:  .   Date:  03/31/2024  ID:  Troy Turner, DOB [birth year], MRN [id number] PCP:  Imelda Man, MD  Former Cardiology Providers: Dr. Ros[facility] Providers Cardiologist:  None , [facility] (established care 07/05/2020) Electrophysiologist:  None  Click to update primary MD,subspecialty MD or APP then REFRESH:1}    Chief Complaint  Patient presents with   Nonobstructive atherosclerosis of coronary artery   Follow-up    History of Present Illness: .   Troy Turner is a 68 y.o. Caucasian male whose past medical history and cardiovascular risk factors includes: Mild nonobstructive CAD, history of Graves' disease, hypothyroidism, hypertension, anxiety, depression, former smoker, former excessive alcohol use, advanced age.   Since establishing care patient did undergo ischemic workup when he was experiencing effort related dyspnea.  Ischemic workup eventually led to a left heart catheterization which noted mild nonobstructive disease in the LAD/diagonal distribution.  His cholesterol medications were changed at that time from simvastatin to Crestor  20 mg p.o. nightly.  The higher dose of statin therapy did improve his lipids significantly; however, he was having intolerance.  At the last office visit Crestor  was reduced to 5 mg p.o. daily.  Patient presents today for 2-year follow-up visit.  Since last office visit patient denies any anginal chest pain or heart failure symptoms.  No hospitalizations or urgent care visits for cardiovascular reasons.  He has been compliant with his medical therapy.  4# of weight gain since the last visit two years ago.  Physical endurance remains stable, plays tennis / raquet ball every other day . Home SBP are well controlled today numbers are higher due to not taking am medications. The labs done on 02/11/2024 reflect him being on Zetia. Statins were stopped due to myalgia.    Review of Systems: .   Review of Systems   Cardiovascular:  Negative for chest pain, claudication, irregular heartbeat, leg swelling, near-syncope, orthopnea, palpitations, paroxysmal nocturnal dyspnea and syncope.  Respiratory:  Negative for shortness of breath.   Hematologic/Lymphatic: Negative for bleeding problem.    Studies Reviewed:   EKG: EKG Interpretation Date/Time:  Thursday Mar 31 2024 08:12:39 EDT Ventricular Rate:  59 PR Interval:  190 QRS Duration:  100 QT Interval:  444 QTC Calculation: 439 R Axis:   63  Text Interpretation: Sinus bradycardia When compared with ECG of 07-Aug-2020 06:01, No significant change was found Confirmed by Olinda Bertrand [phone]) on 03/31/2024 8:19:48 AM  Echocardiogram: 07/19/2020:  Left ventricle cavity is normal in size. Moderate concentric hypertrophy of the left ventricle. Normal global wall motion. Normal LV systolic function with EF 55%. Doppler evidence of grade I (impaired) diastolic dysfunction, normal LAP.  Aneurysmal interatrial septum without 2D or color Doppler evidence of interatrial shunt.  No signifiant valvular abnormality.   Stress Testing: Lexiscan /modified Bruce Tetrofosmin stress test 07/17/2020: Intermediate risk study.  Left Heart Catheterization 08/07/20:  Except for a mild 40% D1 stenosis in minor 10 to 15% luminal irregularity in the proximal and mid LAD, normal left dominant coronary arteries.  Normal LV systolic function. Suspect abnormal EKG and abnormal stress nuclear study was related to hypertension with hypertensive heart disease as the coronary vessels are tortuous.  Suspect mild chronic diastolic heart failure.  Continue primary prevention is indicated.  40 mL contrast utilized.   Lower Extremity Arterial Duplex 07/19/2020:  No hemodynamically significant stenoses are identified in the right lower extremity arterial system. No hemodynamically significant stenoses are identified in the  left lower extremity arterial system.  This exam reveals normal  perfusion of the right and left lower extremity (ABI 1.00).  Dampened flow in the left anterior tibial artery with mild plaque noted.  Evaluate patient for non-vascular pseud-oclaudication.  RADIOLOGY: NA  Risk Assessment/Calculations:   NA   Labs:    Lipid profile: Collected: 09/05/2019 Total cholesterol 184, triglycerides 95, HDL 54, LDL 111, non-HDL 130.  Lipid Panel   2021-09-17    Cholesterol 158   <200  Cholesterol / HDL Ratio 2.51   0.00-4.99  HDL Cholesterol 63   >39  LDL Cholesterol (Calculation) 74   <130  LDL/HDL Ratio 1.2   <3.3  Non-HDL Cholesterol 95   <130  Triglycerides 106   <150   External Labs: Collected: March 2025 [facility] database. Total cholesterol 205, triglycerides 92, HDL 55, LDL 133  Physical Exam:    Today's Vitals   03/31/24 0814  BP: (!) 140/88  Pulse: (!) 56  Resp: 16  SpO2: 97%  Weight: 246 lb (111.6 kg)  Height: 5\' 11"  (1.803 m)   Body mass index is 34.31 kg/m. Wt Readings from Last 3 Encounters:  03/31/24 246 lb (111.6 kg)  04/15/22 242 lb (109.8 kg)  09/03/20 226 lb (102.5 kg)    Physical Exam  Constitutional: No distress.  hemodynamically stable  Neck: No JVD present.  Cardiovascular: Normal rate, regular rhythm, S1 normal and S2 normal. Exam reveals no gallop, no S3 and no S4.  No murmur heard. Pulmonary/Chest: Effort normal and breath sounds normal. No stridor. He has no wheezes. He has no rales.  Musculoskeletal:        General: No edema.     Cervical back: Neck supple.  Skin: Skin is warm.     Impression & Recommendation(s):  Impression:   ICD-10-CM   1. Nonobstructive atherosclerosis of coronary artery  I25.10 EKG 12-Lead    2. Mixed hyperlipidemia  E78.2     3. Statin intolerance  Z78.9     4. Benign hypertension  I10     5. Former smoker  Z87.891     6. Hx of Graves' disease  Z86.39        Recommendation(s):  Nonobstructive atherosclerosis of coronary artery Denies anginal chest pain or heart failure  symptoms. EKG is nonischemic. Overall functional capacity remains relatively stable. No use of sublingual nitroglycerin  tablets since the last office visit. Prior echo and heart catheterization results were reviewed Reemphasized the importance of secondary prevention with focus on improving her modifiable cardiovascular risk factors such as glycemic control, lipid management, blood pressure control, weight loss.  Mixed hyperlipidemia Statin intolerance Has tried simvastatin and Crestor  (various doses) - has been intolerant.  Currently on Zetia and LDL is 133mg /dL Discontinue Zetia as LDL levels are not at goal. Will send in prescription for Nexlizet  if the prescription is approved we will repeat fasting lipids in 6 weeks. If insurance will not approve Nexlizet  patient is willing to try PCSK9 inhibitors.  Benign hypertension Office blood pressures are not at goal as he did not take his morning meds. Home blood pressures otherwise are very well-controlled. Continue amlodipine 10 mg p.o. daily. Continue lisinopril/hydrochlorothiazide 20/12.5 milligrams p.o. daily.  Former smoker Reemphasized importance of complete smoking cessation.  Hx of Graves' disease Well controlled, no issues since last visit.    Orders Placed:  Orders Placed This Encounter  Procedures   EKG 12-Lead     Final Medication List:   No orders of the defined types  were placed in this encounter.   Medications Discontinued During This Encounter  Medication Reason   aspirin  81 MG chewable tablet Patient Preference   LORazepam (ATIVAN) 1 MG tablet Patient Preference   rosuvastatin  (CRESTOR ) 5 MG tablet Patient Preference     Current Outpatient Medications:    amLODipine (NORVASC) 10 MG tablet, Take 10 mg by mouth daily., Disp: , Rfl:    ezetimibe (ZETIA) 10 MG tablet, Take 10 mg by mouth daily., Disp: , Rfl:    levothyroxine (SYNTHROID) 150 MCG tablet, Take 150 mcg by mouth daily before breakfast. , Disp: ,  Rfl:    lisinopril-hydrochlorothiazide (ZESTORETIC) 20-12.5 MG tablet, Take 1 tablet by mouth daily., Disp: , Rfl:    sertraline (ZOLOFT) 100 MG tablet, Take 100 mg by mouth at bedtime. , Disp: , Rfl:    traZODone (DESYREL) 100 MG tablet, Take 100 mg by mouth at bedtime., Disp: , Rfl:    nitroGLYCERIN  (NITROSTAT ) 0.4 MG SL tablet, Place 1 tablet (0.4 mg total) under the tongue every 5 (five) minutes as needed for chest pain., Disp: 30 tablet, Rfl: 0  Consent:   NA  Disposition:   12 months sooner if needed.   His questions and concerns were addressed to his satisfaction. He voices understanding of the recommendations provided during this encounter.    Signed, Awilda Bogus, [facility]  [facility]  03/31/2024 8:24 AM

## 2024-03-31 NOTE — Telephone Encounter (Signed)
 Patient Advocate Encounter   The patient was approved for a [employer] grant that will help cover the cost of Nexlizet  Total amount awarded, 2500.00.  Effective: 03/01/24 - 02/28/25   [id number] NWG:[id number] QMVHQ:[id number] [id number]  [employer] ID: [id number]   Pharmacy provided with approval and processing information. Patient informed via mychart

## 2024-03-31 NOTE — Telephone Encounter (Signed)
 Pharmacy Patient Advocate Encounter  Received notification from [facility] that Prior Authorization for Nexlizet  has been APPROVED from 03/31/24 to 11/30/2098. Ran test claim, Copay is $375.55-  one month. This test claim was processed through [facility]- copay amounts may vary at other pharmacies due to pharmacy/plan contracts, or as the patient moves through the different stages of their insurance plan.   PA #/Case ID/Reference #: [id number]

## 2024-08-16 ENCOUNTER — Ambulatory Visit: Payer: Self-pay | Admitting: Cardiology

## 2024-08-16 DIAGNOSIS — I10 Essential (primary) hypertension: Secondary | ICD-10-CM

## 2024-08-16 LAB — COMPREHENSIVE METABOLIC PANEL WITH GFR
ALT: 19 IU/L (ref 0–44)
AST: 18 IU/L (ref 0–40)
Albumin: 4.3 g/dL (ref 3.9–4.9)
Alkaline Phosphatase: 39 IU/L — ABNORMAL LOW (ref 47–123)
BUN/Creatinine Ratio: 18 (ref 10–24)
BUN: 24 mg/dL (ref 8–27)
Bilirubin Total: 0.6 mg/dL (ref 0.0–1.2)
CO2: 24 mmol/L (ref 20–29)
Calcium: 9.4 mg/dL (ref 8.6–10.2)
Chloride: 101 mmol/L (ref 96–106)
Creatinine, Ser: 1.35 mg/dL — ABNORMAL HIGH (ref 0.76–1.27)
Globulin, Total: 2.1 g/dL (ref 1.5–4.5)
Glucose: 86 mg/dL (ref 70–99)
Potassium: 4.4 mmol/L (ref 3.5–5.2)
Sodium: 138 mmol/L (ref 134–144)
Total Protein: 6.4 g/dL (ref 6.0–8.5)
eGFR: 58 mL/min/1.73 — ABNORMAL LOW (ref >59)

## 2024-08-16 LAB — LDL CHOLESTEROL, DIRECT: LDL Direct: 98 mg/dL (ref 0–99)

## 2024-08-19 NOTE — Progress Notes (Signed)
 Attempt to called patient no answer, patient does have his DPR updated, did not leave a vm.

## 2024-08-22 NOTE — Addendum Note (Signed)
 Addended by: Kostantinos Tallman W on: 08/22/2024 05:02 PM   Modules accepted: Orders

## 2024-09-08 ENCOUNTER — Ambulatory Visit: Payer: Self-pay | Admitting: Cardiology

## 2024-09-08 DIAGNOSIS — E782 Mixed hyperlipidemia: Secondary | ICD-10-CM

## 2024-09-08 LAB — LAB REPORT - SCANNED: EGFR: 64

## 2024-09-23 NOTE — Telephone Encounter (Signed)
 Nexilet caused muscle ache so its not d/c'd.  Could you please refer to PharmD to evaluate candidacy for PCSK9i.   Bora Bost Lacombe, DO, FACC

## 2024-09-27 ENCOUNTER — Other Ambulatory Visit (HOSPITAL_COMMUNITY): Payer: Self-pay

## 2024-09-27 ENCOUNTER — Encounter: Payer: Self-pay | Admitting: Pharmacist

## 2024-09-27 ENCOUNTER — Ambulatory Visit: Attending: Internal Medicine | Admitting: Pharmacist

## 2024-09-27 ENCOUNTER — Telehealth: Payer: Self-pay | Admitting: Pharmacist

## 2024-09-27 DIAGNOSIS — E782 Mixed hyperlipidemia: Secondary | ICD-10-CM | POA: Diagnosis present

## 2024-09-27 DIAGNOSIS — M791 Myalgia, unspecified site: Secondary | ICD-10-CM | POA: Diagnosis present

## 2024-09-27 DIAGNOSIS — T466X5A Adverse effect of antihyperlipidemic and antiarteriosclerotic drugs, initial encounter: Secondary | ICD-10-CM | POA: Diagnosis present

## 2024-09-27 DIAGNOSIS — I2510 Atherosclerotic heart disease of native coronary artery without angina pectoris: Secondary | ICD-10-CM | POA: Diagnosis present

## 2024-09-27 DIAGNOSIS — T466X5D Adverse effect of antihyperlipidemic and antiarteriosclerotic drugs, subsequent encounter: Secondary | ICD-10-CM

## 2024-09-27 NOTE — Patient Instructions (Addendum)
 It was nice meeting you today  We would like your LDL (bad cholesterol) to be less than 70  The medications we discussed today are called Repatha and Leqvio  I will submit the prior authorization for Repatha first and apply the [employer] grant  Once you start the medication we would like to recheck your fasting lipid panel in about 3 months  Please let us  know if you have any questions or concerns  Medford Bolk, PharmD, BCACP, CDCES, CPP [facility] [address] [place], [place] [address] Phone: [phone]; Fax: [phone] 09/27/2024 2:06 PM

## 2024-09-27 NOTE — Telephone Encounter (Signed)
 Please complete PA for Repatha and/or Praluent

## 2024-09-27 NOTE — Telephone Encounter (Signed)
 Plan prefers Praluent. Repatha is non form. Please advise what strength of Praluent you would like for us  to proceed with.

## 2024-09-27 NOTE — Progress Notes (Signed)
 Patient ID: Troy Turner                 DOB: 1956-07-06                    MRN: [id number]     HPI: Troy Turner is a 68 y.o. male patient referred to lipid clinic by Dr Michele. PMH is significant for Graves Disease, HLD, HTN, and CAD.  Patient is statin intolerant.  Patient presents today in good spirits. Cares for his wife at home who is currently in hospice care. He is sole caregiver so tries not to leave house often.  Has tried simvastatin, atorvastatin, and rosuvastatin  and all caused myalgias. Was switched to ezetimibe and then Nexlizet  and had worse side effects which resolved in 2 days after discontinuing.    Current Medications: N/A  Intolerances:  Statins Zetia Nexlizet   Risk Factors:  CAD HTN HLD   LDL goal: <70  Labs: Direct LDL 98, TC 173, HDL 58, Trigs 117, LDL 94 (08/15/24)  Imaging: Diagnostic Dominance: Left Left Anterior Descending  Prox LAD to Mid LAD lesion is 10% stenosed.    First Diagonal Branch  1st Diag lesion is 40% stenosed.      Past Medical History:  Diagnosis Date   Anxiety    Depression    Erectile dysfunction    History of Graves' disease    History of kidney stones    Hyperlipidemia    Hypertension    Hypothyroidism    history of graves disease   Hypothyroidism    Peyronie's disease     Current Outpatient Medications on File Prior to Visit  Medication Sig Dispense Refill   amLODipine (NORVASC) 10 MG tablet Take 10 mg by mouth daily.     Bempedoic Acid-Ezetimibe (NEXLIZET ) 180-10 MG TABS Take 1 tablet by mouth in the morning. 30 tablet 3   levothyroxine (SYNTHROID) 150 MCG tablet Take 150 mcg by mouth daily before breakfast.      lisinopril-hydrochlorothiazide (ZESTORETIC) 20-12.5 MG tablet Take 1 tablet by mouth daily.     nitroGLYCERIN  (NITROSTAT ) 0.4 MG SL tablet Place 1 tablet (0.4 mg total) under the tongue every 5 (five) minutes as needed for chest pain. 30 tablet 0   sertraline (ZOLOFT) 100 MG tablet Take 100  mg by mouth at bedtime.      traZODone (DESYREL) 100 MG tablet Take 100 mg by mouth at bedtime.     No current facility-administered medications on file prior to visit.    No Known Allergies  Assessment/Plan:  1. Hyperlipidemia - Patient's last LDL of 98  is above goal of <70 due to CAD. Unfortunately intolerant to all statins, plus Zetia and bempedoic acid. Discussed next options with patient including PCSK9i and Leqvio. Since patient tries not to leave his house due to caring for his wife, is interested in trying Repatha.  Using demo pen, educated patient on mechanism of action, storage, site selection, administration, and possible adverse effects. Will submit PA and contact patient with result. Recheck lipid panel in 3 months.  Start PCSK9i q 2 weeks Recheck lipid panel in 3 months  Troy Turner, PharmD, BCACP, CDCES, CPP [facility] [address] [place], [place] [address] Phone: [phone]; Fax: [phone] 09/27/2024 4:29 PM

## 2024-09-28 ENCOUNTER — Other Ambulatory Visit (HOSPITAL_COMMUNITY): Payer: Self-pay

## 2024-09-28 NOTE — Telephone Encounter (Signed)
 Pharmacy Patient Advocate Encounter   Received notification from Physician's Office that prior authorization for PRALUENT is required/requested.   Insurance verification completed.   The patient is insured through [facility].   Per test claim: PA required; PA submitted to above mentioned insurance via Latent Key/confirmation #/EOC [id number] Status is pending

## 2024-10-03 ENCOUNTER — Other Ambulatory Visit (HOSPITAL_COMMUNITY): Payer: Self-pay

## 2024-10-03 NOTE — Telephone Encounter (Signed)
 Pharmacy Patient Advocate Encounter  Received notification from [employer] that Prior Authorization for PRALUENT has been APPROVED from 09/29/24 to 11/30/98. Ran test claim, Copay is $116.57. This test claim was processed through [facility]- copay amounts may vary at other pharmacies due to pharmacy/plan contracts, or as the patient moves through the different stages of their insurance plan.

## 2024-10-10 ENCOUNTER — Encounter: Payer: Self-pay | Admitting: Pharmacist

## 2024-10-10 DIAGNOSIS — E782 Mixed hyperlipidemia: Secondary | ICD-10-CM

## 2024-10-10 DIAGNOSIS — I2510 Atherosclerotic heart disease of native coronary artery without angina pectoris: Secondary | ICD-10-CM

## 2024-10-17 ENCOUNTER — Other Ambulatory Visit: Payer: Self-pay

## 2024-10-17 ENCOUNTER — Other Ambulatory Visit (HOSPITAL_COMMUNITY): Payer: Self-pay

## 2024-10-17 ENCOUNTER — Telehealth: Payer: Self-pay | Admitting: Pharmacy Technician

## 2024-10-17 MED ORDER — PRALUENT 75 MG/ML ~~LOC~~ SOAJ
75.0000 mg | SUBCUTANEOUS | 1 refills | Status: AC
  Filled 2024-10-17: qty 6, 84d supply, fill #0

## 2024-10-17 NOTE — Addendum Note (Signed)
 Addended by: DARRELL BRUCKNER on: 10/17/2024 03:42 PM   Modules accepted: Orders

## 2024-10-17 NOTE — Telephone Encounter (Signed)
   Effective 03/01/24 - 02/28/25 [id number] [id number] PI:[id number]   [employer] ID: [id number]  In wam

## 2025-01-04 ENCOUNTER — Encounter: Payer: Self-pay | Admitting: Pharmacist

## 2025-01-04 DIAGNOSIS — E782 Mixed hyperlipidemia: Secondary | ICD-10-CM

## 2025-01-04 DIAGNOSIS — I2510 Atherosclerotic heart disease of native coronary artery without angina pectoris: Secondary | ICD-10-CM
